# Patient Record
Sex: Female | Born: 1945 | Race: Black or African American | Hispanic: No | Smoking: Never smoker
Health system: Southern US, Community
[De-identification: ages and names within clinical notes are randomized; demographics above are authoritative.]

## PROBLEM LIST (undated history)

## (undated) HISTORY — PX: ABDOMINAL HYSTERECTOMY: SHX81

---

## 2003-02-11 ENCOUNTER — Emergency Department (HOSPITAL_COMMUNITY): Admission: EM | Admit: 2003-02-11 | Discharge: 2003-02-11 | Payer: Self-pay | Admitting: Emergency Medicine

## 2003-03-01 ENCOUNTER — Encounter: Admission: RE | Admit: 2003-03-01 | Discharge: 2003-03-01 | Payer: Self-pay | Admitting: Internal Medicine

## 2006-10-21 ENCOUNTER — Emergency Department (HOSPITAL_COMMUNITY): Admission: EM | Admit: 2006-10-21 | Discharge: 2006-10-21 | Payer: Self-pay | Admitting: Emergency Medicine

## 2007-01-04 ENCOUNTER — Ambulatory Visit (HOSPITAL_COMMUNITY): Admission: RE | Admit: 2007-01-04 | Discharge: 2007-01-05 | Payer: Self-pay | Admitting: Urology

## 2007-10-03 ENCOUNTER — Emergency Department (HOSPITAL_COMMUNITY): Admission: EM | Admit: 2007-10-03 | Discharge: 2007-10-04 | Payer: Self-pay | Admitting: Emergency Medicine

## 2007-10-06 ENCOUNTER — Emergency Department (HOSPITAL_COMMUNITY): Admission: EM | Admit: 2007-10-06 | Discharge: 2007-10-06 | Payer: Self-pay | Admitting: Emergency Medicine

## 2008-07-14 ENCOUNTER — Inpatient Hospital Stay (HOSPITAL_COMMUNITY): Admission: EM | Admit: 2008-07-14 | Discharge: 2008-07-17 | Payer: Self-pay | Admitting: Family Medicine

## 2008-07-14 ENCOUNTER — Ambulatory Visit: Payer: Self-pay | Admitting: Cardiology

## 2008-07-14 ENCOUNTER — Ambulatory Visit: Payer: Self-pay | Admitting: Internal Medicine

## 2008-07-16 ENCOUNTER — Encounter: Payer: Self-pay | Admitting: Infectious Diseases

## 2008-07-25 ENCOUNTER — Ambulatory Visit: Payer: Self-pay | Admitting: Internal Medicine

## 2009-07-18 ENCOUNTER — Emergency Department (HOSPITAL_COMMUNITY): Admission: EM | Admit: 2009-07-18 | Discharge: 2009-07-18 | Payer: Self-pay | Admitting: Emergency Medicine

## 2010-05-13 NOTE — Assessment & Plan Note (Signed)
Summary: NEW HFU-BMET-CHEST PAIN/CFB   Vital Signs:  Patient profile:   65 year old female Height:      65 inches Weight:      193.4 pounds BMI:     32.30 Temp:     97.6 degrees F oral Pulse rate:   91 / minute BP sitting:   140 / 88  (right arm)  Vitals Entered By: Filomena Jungling NT II (July 25, 2008 1:33 PM) CC: HFU Is Patient Diabetic? No Nutritional Status BMI of > 30 = obese  Have you ever been in a relationship where you felt threatened, hurt or afraid?No   Does patient need assistance? Functional Status Self care Ambulation Normal   CC:  HFU.  History of Present Illness: Stephanie Smith is a 6 yo lady recently admitted to the hospital for CP comes today for follow up.   1) CP: Had 1episode of CP pressure like 4-5 days ago, lasted 1 minute and when the pt. was sleeping. It was 3/10. No SOB, n/v or diaphoresis. No cough, fever, chills. She has acid reflux. The pain is better with walking probably per patient and rest makes it better.   2) Preventive health care: Mammogram was done years ago, had 2 negative pap smears in last 6 years.   Preventive Screening-Counseling & Management     Smoking Status: never  Allergies (verified): 1)  ! Morphine 2)  ! Asa  Social History:    Smoking Status:  never  Review of Systems      See HPI  Physical Exam  General:  alert.   Mouth:  pharynx pink and moist.   Lungs:  normal breath sounds, no crackles, and no wheezes.   Heart:  normal rate, regular rhythm, no murmur, no gallop, and no rub.   Abdomen:  soft, non-tender, normal bowel sounds, no distention, and no masses.   Extremities:  trace left pedal edema and trace right pedal edema.   Neurologic:  alert & oriented X3.     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 Had 1 episode of minor CP after d/c. Acid reflux may be contributing. Asked the pt. to continue prilosec.   Problem # 2:   PREVENTIVE HEALTH CARE (ICD-V70.0) Needs mammogram and pap smear. Pt. refused both.   Complete Medication List: 1)  Tylenol 8 Hour 650 Mg Cr-tabs (Acetaminophen) .Marland Kitchen.. 1 pill by mouth  every 6 hourly as needed. 2)  Cvs Omeprazole 20 Mg Tbec (Omeprazole) .Marland Kitchen.. 1 pill by mouth daily.  Patient Instructions: 1)  Please schedule a follow-up appointment as needed. 2)  Limit your Sodium (Salt) to less than 2 grams a day(slightly less than 1/2 a teaspoon) to prevent fluid retention, swelling, or worsening of symptoms. 3)  It is important that you exercise regularly at least 20 minutes 5 times a week. If you develop chest pain, have severe difficulty breathing, or feel very tired , stop exercising immediately and seek medical attention. 4)  You need to lose weight. Consider a lower calorie diet and regular exercise.

## 2010-07-02 LAB — POCT RAPID STREP A (OFFICE): Streptococcus, Group A Screen (Direct): NEGATIVE

## 2010-07-23 LAB — DIFFERENTIAL
Basophils Absolute: 0 10*3/uL (ref 0.0–0.1)
Basophils Relative: 1 % (ref 0–1)
Eosinophils Absolute: 0.1 10*3/uL (ref 0.0–0.7)
Eosinophils Relative: 1 % (ref 0–5)
Lymphocytes Relative: 21 % (ref 12–46)
Lymphs Abs: 1.9 10*3/uL (ref 0.7–4.0)
Monocytes Absolute: 0.6 10*3/uL (ref 0.1–1.0)
Monocytes Relative: 6 % (ref 3–12)
Neutro Abs: 6.4 10*3/uL (ref 1.7–7.7)
Neutrophils Relative %: 71 % (ref 43–77)

## 2010-07-23 LAB — CARDIAC PANEL(CRET KIN+CKTOT+MB+TROPI)
CK, MB: 0.8 ng/mL (ref 0.3–4.0)
CK, MB: 0.9 ng/mL (ref 0.3–4.0)
CK, MB: 0.9 ng/mL (ref 0.3–4.0)
Relative Index: INVALID (ref 0.0–2.5)
Relative Index: INVALID (ref 0.0–2.5)
Relative Index: INVALID (ref 0.0–2.5)
Total CK: 31 U/L (ref 7–177)
Total CK: 67 U/L (ref 7–177)
Total CK: 68 U/L (ref 7–177)
Troponin I: 0.01 ng/mL (ref 0.00–0.06)
Troponin I: <0.01 ng/mL (ref 0.00–0.06)
Troponin I: <0.01 ng/mL (ref 0.00–0.06)

## 2010-07-23 LAB — POCT I-STAT, CHEM 8
BUN: 9 mg/dL (ref 6–23)
Calcium, Ion: 1.18 mmol/L (ref 1.12–1.32)
Chloride: 103 mEq/L (ref 96–112)
Creatinine, Ser: 1.2 mg/dL (ref 0.4–1.2)
Glucose, Bld: 89 mg/dL (ref 70–99)
HCT: 46 % (ref 36.0–46.0)
Hemoglobin: 15.6 g/dL — ABNORMAL HIGH (ref 12.0–15.0)
Potassium: 4.2 mEq/L (ref 3.5–5.1)
Sodium: 139 mEq/L (ref 135–145)
TCO2: 28 mmol/L (ref 0–100)

## 2010-07-23 LAB — CBC
HCT: 38.3 % (ref 36.0–46.0)
HCT: 45.2 % (ref 36.0–46.0)
Hemoglobin: 13 g/dL (ref 12.0–15.0)
Hemoglobin: 15.6 g/dL — ABNORMAL HIGH (ref 12.0–15.0)
MCHC: 34 g/dL (ref 30.0–36.0)
MCHC: 34.5 g/dL (ref 30.0–36.0)
MCHC: 34.7 g/dL (ref 30.0–36.0)
MCV: 96.3 fL (ref 78.0–100.0)
MCV: 96.3 fL (ref 78.0–100.0)
MCV: 97 fL (ref 78.0–100.0)
Platelets: 207 10*3/uL (ref 150–400)
Platelets: 228 10*3/uL (ref 150–400)
Platelets: 263 10*3/uL (ref 150–400)
RBC: 3.95 MIL/uL (ref 3.87–5.11)
RBC: 4.7 MIL/uL (ref 3.87–5.11)
RDW: 12.7 % (ref 11.5–15.5)
RDW: 12.8 % (ref 11.5–15.5)
RDW: 13.3 % (ref 11.5–15.5)
WBC: 6.9 10*3/uL (ref 4.0–10.5)
WBC: 7.3 10*3/uL (ref 4.0–10.5)
WBC: 9.1 10*3/uL (ref 4.0–10.5)

## 2010-07-23 LAB — COMPREHENSIVE METABOLIC PANEL
ALT: 18 U/L (ref 0–35)
AST: 21 U/L (ref 0–37)
Albumin: 3.6 g/dL (ref 3.5–5.2)
Alkaline Phosphatase: 69 U/L (ref 39–117)
BUN: 8 mg/dL (ref 6–23)
CO2: 27 mEq/L (ref 19–32)
Calcium: 9.6 mg/dL (ref 8.4–10.5)
Chloride: 105 mEq/L (ref 96–112)
Creatinine, Ser: 0.95 mg/dL (ref 0.4–1.2)
GFR calc Af Amer: >60 mL/min (ref >60)
GFR calc non Af Amer: 60 mL/min — ABNORMAL LOW (ref >60)
Glucose, Bld: 129 mg/dL — ABNORMAL HIGH (ref 70–99)
Potassium: 4.1 mEq/L (ref 3.5–5.1)
Sodium: 140 mEq/L (ref 135–145)
Total Bilirubin: 0.7 mg/dL (ref 0.3–1.2)
Total Protein: 7.7 g/dL (ref 6.0–8.3)

## 2010-07-23 LAB — POCT CARDIAC MARKERS
CKMB, poc: <1 ng/mL — ABNORMAL LOW (ref 1.0–8.0)
Myoglobin, poc: 72.2 ng/mL (ref 12–200)
Troponin i, poc: <0.05 ng/mL (ref 0.00–0.09)

## 2010-07-23 LAB — POCT URINALYSIS DIP (DEVICE)
Bilirubin Urine: NEGATIVE
Glucose, UA: NEGATIVE mg/dL
Hgb urine dipstick: NEGATIVE
Ketones, ur: NEGATIVE mg/dL
Nitrite: NEGATIVE
Protein, ur: NEGATIVE mg/dL
Specific Gravity, Urine: 1.01 (ref 1.005–1.030)
Urobilinogen, UA: 0.2 mg/dL (ref 0.0–1.0)
pH: 6 (ref 5.0–8.0)

## 2010-07-23 LAB — TSH: TSH: 2.207 u[IU]/mL (ref 0.350–4.500)

## 2010-07-23 LAB — LIPID PANEL
Cholesterol: 154 mg/dL (ref 0–200)
HDL: 49 mg/dL (ref >39)
LDL Cholesterol: 94 mg/dL (ref 0–99)
Total CHOL/HDL Ratio: 3.1 RATIO
Triglycerides: 55 mg/dL (ref <150)
VLDL: 11 mg/dL (ref 0–40)

## 2010-07-23 LAB — BASIC METABOLIC PANEL
BUN: 8 mg/dL (ref 6–23)
BUN: 9 mg/dL (ref 6–23)
CO2: 23 mEq/L (ref 19–32)
Calcium: 9.6 mg/dL (ref 8.4–10.5)
Chloride: 105 mEq/L (ref 96–112)
Chloride: 109 mEq/L (ref 96–112)
Creatinine, Ser: 0.72 mg/dL (ref 0.4–1.2)
Creatinine, Ser: 1 mg/dL (ref 0.4–1.2)
GFR calc Af Amer: >60 mL/min (ref >60)
GFR calc non Af Amer: 56 mL/min — ABNORMAL LOW (ref >60)
Glucose, Bld: 98 mg/dL (ref 70–99)
Potassium: 4 mEq/L (ref 3.5–5.1)
Sodium: 138 mEq/L (ref 135–145)

## 2010-07-23 LAB — HEPARIN LEVEL (UNFRACTIONATED)
Heparin Unfractionated: 0.78 IU/mL — ABNORMAL HIGH (ref 0.30–0.70)
Heparin Unfractionated: 0.85 IU/mL — ABNORMAL HIGH (ref 0.30–0.70)
Heparin Unfractionated: <0.1 IU/mL — ABNORMAL LOW (ref 0.30–0.70)

## 2010-07-23 LAB — HEMOGLOBIN A1C
Hgb A1c MFr Bld: 5.5 % (ref 4.6–6.1)
Mean Plasma Glucose: 111 mg/dL

## 2010-07-23 LAB — PROTIME-INR
INR: 1.2 (ref 0.00–1.49)
Prothrombin Time: 15.2 seconds (ref 11.6–15.2)

## 2010-07-23 LAB — D-DIMER, QUANTITATIVE: D-Dimer, Quant: 0.24 ug/mL-FEU (ref 0.00–0.48)

## 2010-07-23 LAB — MAGNESIUM: Magnesium: 2.2 mg/dL (ref 1.5–2.5)

## 2010-07-23 LAB — CK TOTAL AND CKMB (NOT AT ARMC)
CK, MB: 0.9 ng/mL (ref 0.3–4.0)
Relative Index: INVALID (ref 0.0–2.5)
Total CK: 42 U/L (ref 7–177)

## 2010-07-23 LAB — TROPONIN I: Troponin I: <0.01 ng/mL (ref 0.00–0.06)

## 2010-08-26 NOTE — Consult Note (Signed)
Stephanie Smith, WARF NO.:  [id number]   MEDICAL RECORD NO.:  [id number]           PATIENT TYPE:   LOCATION:                                 FACILITY:   PHYSICIAN:  Jonelle Sidle, MD DATE OF BIRTH:  10-17-1945   DATE OF CONSULTATION:  DATE OF DISCHARGE:                                 CONSULTATION   REQUESTING PHYSICIAN:  Alvester Morin, MD with the Internal Medicine  Teaching Service.   REASON FOR CONSULTATION:  Chest pain, back pain, and shortness of  breath.   HISTORY OF PRESENT ILLNESS:  Stephanie Smith is a pleasant 65 year old woman  with no reported major medical conditions, although no regular medical  followup, who presents with recent onset upper back as well as chest  discomfort associated with shortness of breath and functional  limitation.  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  On Wednesday, she left work early around  noon and went home to take a nap.  She felt fatigued and when she woke  up noted discomfort in her back described as a soreness.  This waxed  and waned, and was noticeable again on Thursday.  She did not go to work  that day and when ambulating even just around her house, she felt  significantly more short of breath than normal, and also felt an  occasional fluttering in her chest as well as subsequently chest  pressure that caused her to stop what she was doing.  These symptoms  recurred over the next few days and ultimately she presented to the  hospital for further assessment.  Her electrocardiogram is abnormal at  baseline, although nonspecifically so, showing increased voltage with  diffuse nonspecific ST-T wave changes and poor anterior R-wave  progression.  Her cardiac markers are normal including troponin I levels  and CK-MB levels.  Since observation in the hospital, she has had  recurrent chest pressure at rest.  Her chest x-ray shows mild  cardiomegaly with no active  disease process and no mediastinal  enlargement.  We have been asked to assist with her evaluation.   ALLERGIES:  MORPHINE.  She apparently has had some ASPIRIN intolerance  due to nausea and GI upset, but no frank allergy.   MEDICATIONS:  At the present time include,  1. Aspirin 325 mg p.o. daily.  2. Lopressor 12.5 mg p.o. b.i.d.  3. Protonix 40 mg p.o. daily.  4. Sublingual nitroglycerin 0.4 mg p.r.n.  5. Tylenol 650 mg p.o. q.4 h. p.r.n.   She is on no regular medications at home.   PAST MEDICAL HISTORY:  Bladder prolapse status post repair in September  2008.  She has had previous colonoscopy and Pap smears.  She had a  salpingo-oophorectomy and hysterectomy as well as an appendectomy.  She  denies any longstanding problems of hypertension, hyperlipidemia or type  2 diabetes mellitus.  No known history of obstructive coronary artery  disease or myocardial infarction.   FAMILY HISTORY:  Reviewed.  She states that heart failure  has been  present in siblings and other first-degree relatives beginning at age 28-  37.  Coronary artery disease also noted in this population.   SOCIAL HISTORY:  The patient is divorced.  She denies any tobacco or  alcohol use.  She has three children.  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  She does not have regular medical followup.   REVIEW OF SYSTEMS:  As outlined above.  She typically has no exertional  chest pain or limiting breathlessness.  She has had some recent  sneezing, but no cough, fevers, chills, or hemoptysis.  No orthopnea or  PND.  No dizziness or syncope.  She reports a stable appetite.  Otherwise reviewed and negative.   PHYSICAL EXAMINATION:  VITAL SIGNS:  Temperature is 98.2 degrees, heart  rate 75, respirations 18, blood pressure 112/63, oxygen saturation is  100% on room air.  GENERAL:  This is an overweight woman, lying in bed, in no acute  distress without active chest pain at present.  HEENT:  Conjunctiva  and lids normal.  Oropharynx clear.  NECK:  Supple.  No elevated jugular venous pressure.  No loud bruits.  No thyromegaly is noted.  LUNGS:  Clear without labored breathing.  CARDIAC:  Regular rate and rhythm.  Normal S1 and S2.  No pathologic  systolic murmur or S3 gallop.  No pericardial rub is audible.  ABDOMEN:  Obese, nontender.  No hepatomegaly.  Bowel sounds are present.  EXTREMITIES:  Exhibit no frank pitting edema noted distally.  Venous  stasis changes are observed in both legs below the knees.  Distal pulses  are 1+.  SKIN:  Warm and dry.  MUSCULOSKELETAL:  No kyphosis noted.  NEUROPSYCHIATRIC:  The patient is alert and oriented x3.  Affect is  appropriate.   LABORATORY DATA:  WBCs 9.1, hemoglobin 15.6, hematocrit 45.2, platelets  263.  D-dimer 0.24.  Sodium 140, potassium 4.1, chloride 105, bicarb 27,  glucose 129, BUN 8, creatinine 0.9, peak CK of 68, peak CK-MB 0.9, peak  troponin-I 0.01, total cholesterol 154, triglycerides 55, HDL 49, LDL  94.  TSH 2.2.  Urinalysis normal.   IMPRESSION:  Recent onset back discomfort, shortness of breath,  intermittent chest fluttering, and chest pressure concerning for  recent onset unstable angina.  Electrocardiogram is abnormal at  baseline, although in nonspecific fashion and cardiac markers at this  point are normal.  She has had recurrent chest pressure during  observation and her telemetry monitoring is unrevealing.  She does have  some family history of cardiovascular disease as well as heart failure  and had a random elevated glucose of 129 with LDL under 100 on no  specific medical therapy.  She has had no prior diagnosis of coronary  artery disease or previous cardiac risk stratification.   RECOMMENDATIONS:  I reviewed the situation with Ms. Galik and we  discussed both noninvasive and invasive techniques for further diagnosis  of potential underlying ischemic heart disease.  After reviewing the  risk/benefit profile, she  has agreed to proceed with a diagnostic  cardiac catheterization to best define her coronary anatomy and assess  for any potential revascularization options.  She will be continued on  aspirin and beta-blocker therapy, and we will add a heparin infusion  given her recurrent chest pressure during observation at rest.  Would  also send a hemoglobin A1c given her elevated random glucose level.  We  will plan to follow with you.  Jonelle Sidle, MD  Electronically Signed     SGM/MEDQ  D:  07/15/2008  T:  07/16/2008  Job:  [id number]   cc:   Alvester Morin, M.D.

## 2010-08-26 NOTE — Discharge Summary (Signed)
[name], Stephanie Smith NO.:  [id number]   MEDICAL RECORD NO.:  [id number]          PATIENT TYPE:  INP   LOCATION:  4735                         FACILITY:  [facility]   PHYSICIAN:  Stephanie Smith, M.D.  DATE OF BIRTH:  March 24, 1946   DATE OF ADMISSION:  07/14/2008  DATE OF DISCHARGE:  07/17/2008                               DISCHARGE SUMMARY   CONSULTING PHYSICIANS:  Stephanie Sidle, MD, and Stephanie Fells. Excell Seltzer,  MD, of [facility].   DISCHARGE DIAGNOSES:  1. Chest pain and shortness of breath of unknown etiology, cardiac      catheterization performed showing minor nonobstructive coronary      artery disease, chest pain resolved at discharge.  2. Minor nonobstructive coronary artery disease with cardiologist      recommendation of cardiovascular risk management.  3. Back pain of unknown etiology, most likely musculoskeletal.  4. Status post distant hysterectomy.  5. Status post salpingo-oophorectomy in the distant past.  6. Recent bladder prolapse repair in 2008.  7. No other past medical history.   DISCHARGE MEDICATIONS:  1. Tylenol 650 mg p.o. q.6 h. p.r.n. pain.  2. Omeprazole 20 mg p.o. q.a.m.   CONDITION ON DISCHARGE:  The patient was chest pain free and was  asymptomatic upon discharge.  On the day prior to discharge, the patient  underwent coronary artery catheterization which showed minor  nonobstructive CAD, thus ruling out the patient's symptoms as being due  to the cardiac etiology.  The patient is to follow up with Stephanie Smith  of the Outpatient Clinic on July 25, 2008, at 1:30 p.m.  At that  appointment, the patient should be evaluated for continuation of her  symptoms.  The patient does not have a primary care doctor and has not  undergone routine screening for some while.  She reports that her last  Pap and mammogram were at least 5 years ago and her last colonoscopy was  20 years ago.  Please address these at her followup. Please also  start  the patient on a statin to prevent plaque rupture given her CAD, albeit  only minor non-obstructive CAD.   CONSULTATIONS:  Stephanie Sidle, MD, and Stephanie Fells. Excell Seltzer, MD, of  [facility].   PROCEDURES:  1. Left heart catheterization performed by Stephanie Smith on July 16, 2008:  Minor nonobstructive coronary artery disease as outlined      above.  Normal left ventricular function.  2. A 2-D echo performed on July 16, 2008, with following impression:      Overall left ventricular systolic function was normal.  Left      ventricular ejection fraction was estimated to be between 55-60%.      The study was inadequate for evaluation of left ventricular      regional wall motion.  Left atrium was mildly dilated.  3. Two-view chest x-ray on July 14, 2008, with the following      impression:  Mild cardiomegaly.  No active lung disease.   HISTORY AND PHYSICAL:  The patient is a 65 year old  female with no  significant past medical history who presents with a 3-day history of  bilateral upper back pain associated with shortness of breath.  The back  pain extends from her lower scapula to shoulders and is intermittent and  pounding like a baseball bat against her back.  Initially, the patient  reported that the pain improved with movement and the patient has had  corresponding shortness of breath that also improved with movement.  However, the patient later said that the shortness of breath worsened  with exertion and the patient had more typical chest pain.  The patient  denied any other aggravating factors.  The patient reported mild  dizziness, but no syncope.  The patient has had minimal nasal  congestion, but no other upper respiratory tract infection symptoms.  The patient has experienced new increased burping/sour taste in her  mouth over the last few days.  She denies fever, cough, numbness, vision  changes, recent travel, or sick contacts.   PHYSICAL  EXAMINATION:  ADMISSION VITAL SIGNS:  Temperature 98.5, blood  pressure 130-135/76-94, pulse 60-76, respiratory rate 16-20, and O2 sat  98% on room air.  GENERAL:  No apparent distress.  EYES:  EOMI.  ENT:  Moist membranous mucosa.  No oropharyngeal exudate or erythema.  NECK:  No lymphadenopathy.  RESPIRATORY:  Clear to auscultation bilaterally.  CARDIOVASCULAR:  Regular rate and rhythm.  No murmurs, rubs, or gallops.  Normal S1 and S2.  No chest wall tenderness.  GI:  Normoactive bowel sounds, soft, nontender, and nondistended.  EXTREMITIES:  Some mild varicose veins bilaterally.  No cyanosis,  clubbing, or edema.  GU:  No CVA tenderness.  MUSCULOSKELETAL:  Tension thoracovertebral muscle, question of spasms in  the muscles.  NEURO:  Alert and oriented x3, nonfocal.  PSYCH:  Appropriate.   INITIAL LABORATORY DATA:  Sodium 138, potassium 4.0, chloride of 105,  bicarb 23, BUN of 9, creatinine 1.00, and glucose of 98.  D-dimer of  0.24.  CBC as follows:  White blood cell count of 9.1, hemoglobin 15.6,  and platelets of 263.   HOSPITAL COURSE:  1. Chest pain with shortness of breath:  The patient presented with 4-      day history of what sounded like exertional chest pain and      shortness of breath and she had limited medical followup      previously.  She had limited coronary risk factors, really only      significant for a mother who died of heart attack at 50.  The      patient has never smoked, does not have a history of high blood      pressure and was normotensive upon admission, her cholesterol was      notable for an LDL of 94, and the patient was not diabetic.      However, given her typical presentation for cardiac ischemia in the      presence of continued chest discomfort while in the hospital, the      patient had 3 sets of cardiac enzymes all of which were negative,      the patient had EKG which showed nonspecific ST-T wave changes and      poor anterior R-wave  progression, and Cardiology was consulted.      Stephanie Smith came and evaluated the patient on the day after      admission and recommended a coronary catheterization which was      performed by  Stephanie Smith on the subsequent day.  As noted      above, the catheterization only showed minor nonobstructive      coronary artery disease and no intervention was indicated.  Other      considerations for the patient's chest pain were pulmonary embolus      which was ruled out with a negative D-dimer, GI reflux, or      dyspepsia which is possible especially with the patient's sensation      of sour taste in her mouth and sensation of reflux.      Musculoskeletal considerations are also possible given the      patient's tense thoracovertebral muscles.  For the above, the      patient was discharged on PPI and Tylenol.  The patient was      asymptomatic upon discharge and will follow up in our outpatient      clinic on July 25, 2008.  2. Back pain:  The patient was asymptomatic upon discharge and as      p.r.n. Tylenol for her back pain and will follow up in the      Outpatient Clinic as above.  3. Routine health maintenance:  The patient has not had a mammogram,      Pap smear, or colonoscopy in at least 5 years.  She will follow up      in the Outpatient Clinic where the services should be provided.   DISCHARGE LABORATORY DATA:  The patient's last sodium was 140, potassium  3.8, chloride of 109, bicarb 25, BUN of 8, creatinine of 0.72, and  glucose of 90.  The patient's last white blood cell count 7.3,  hemoglobin 12.9, and platelet of 228.   PENDING LABORATORY DATA:  There were no pending labs at this time.      Linward Foster, MD  Electronically Signed      Stephanie Smith, M.D.  Electronically Signed    LW/MEDQ  D:  07/17/2008  T:  07/17/2008  Job:  [id number]   cc:   Stephanie Fells. Excell Seltzer, MD  Jason Coop, MD

## 2010-08-26 NOTE — Op Note (Signed)
NAMEGUSTAVA, Stephanie Smith                ACCOUNT NO.:  [id number]   MEDICAL RECORD NO.:  [id number]          PATIENT TYPE:  OIB   LOCATION:  1434                         FACILITY:  [facility]   PHYSICIAN:  Martina Sinner, MD DATE OF BIRTH:  1946/03/22   DATE OF PROCEDURE:  01/04/2007  DATE OF DISCHARGE:                               OPERATIVE REPORT   PREOPERATIVE DIAGNOSIS:  Vault prolapse, cystocele, small rectocele.   POSTOPERATIVE DIAGNOSIS:  Vault prolapse, cystocele, small rectocele.   PROCEDURE:  Vault prolapse repair, cystocele repair plus graft, plus  cystoscopy.   INDICATIONS FOR PROCEDURE:  Ms. Stephanie Smith has a symptomatic cystocele  with a cuff defect.  She had minimal posterior defect on physical  examination.   DESCRIPTION OF PROCEDURE:  The patient was prepped and draped in the  usual fashion.  Extra care was taken in leg positioning to minimize the  risk of compartment syndrome, neuropathy, and DVT.  Preoperative  laboratory tests were normal.  She was given preoperative antibiotics.   Under anesthesia, her vaginal cuff descended to approximately 2 cm from  the introitus.  With the cuff supported cephalad, she had minimal defect  posteriorly.  In my opinion, she did not have an enterocele.   I placed two 3-0 Vicryl sutures, marking the vaginal apex at the  dimples.  Between two Allis clamps, I made a T-shaped anterior vaginal  wall incision after instilling approximately 16 mL of an epinephrine  lidocaine mixture.  I sharply dissected the vaginal wall from the  pubocervical fascia to the pelvic sidewall bilaterally.  She had a  fairly short anterior vaginal wall in spite of opening the incision to  include the proximal urethra.  I sharply dissected the urethrovesical  angle as well as the white line.  There is no question that I took the  cystocele off the vaginal apex.  There was no enterocele.   I did a two-layer gentle imbricating anterior repair.  This allowed  me  to dissect further laterally.   I then cystoscoped the patient.  There was efflux of indigo carmine from  both ureteral orifices.  She had a very large redundant bladder, as  noted in the preoperative workup.  The trigone was not distorted.  I  gave 2 ampules of indigo carmine, and the whole process took  approximately 15 minutes to make certain there was efflux from both  ureteral orifices.  All along, I thought there was efflux, but it was so  faint, I spent many minutes making certain that there were good jets  bilaterally, and there was.   Following this, I finger dissected bluntly back to the ischial spine  bilaterally.  There little to no bleeding.  The sacrospinous ligament  was palpable, and she had reasonably large ischial spines that were  quite flat.  With a catheter device, I placed a 0 Ethibond at each  ischial spine just including the sacrospinous ligament.  I double  checked the position, and I was very happy with the position of the  sacrospinous fixation sutures.  Using a UR-6 needle, I  then placed 0  Ethibond near the urethrovesical angle through the pelvic sidewall.   I trimmed a 10 x 6-cm dermal graft in the shape of a trapezoid.  I  sutured this to my four sutures.  I was very pleased the way the graft  went back all the way to the ischial spines and to the urethrovesical  angle.  It fit very nicely and did not encroach upon the proximal  urethra.  I trimmed approximately 1 cm of its width to accommodate this.  I cut the Ethibond sutures back to the ischial spines fairly long just  in case I ever had to find them again.   I trimmed approximately a half a centimeter of vaginal wall mucosa from  both sides.  I then irrigated with antibiotic and saline.  I closed the  anterior vaginal wall with running 2-0 Vicryl on a TT-1 needle.   Before I closed the anterior vaginal wall incision, I made certain the  graft was pulled upward underneath the majority of the  bladder.  I was  very pleased with the position of the graft and its area of coverage and  support.  There was no question that it lengthened the vagina, and there  was no vaginal narrowing.   Visually, she had minimal posterior defect.  I did a digital rectal  examination.  She did have some deficiency of the posterior fourchette,  as noted in the preoperative workup.  I really thought she had some  diffuse weakness under reasonable tension and not a lot of obvious  bulging.  I did not feel that she would benefit from a posterior repair,  recognizing potential recurrence rates.   The vagina was irrigated.  A vaginal pack was inserted with Estrace  cream.  The catheter was draining blue urine at the end of the case.   I was pleased with leg position.  The surgery went very well and  hopefully it will reach her treatment goal.           ______________________________  Martina Sinner, MD  Electronically Signed     SAM/MEDQ  D:  01/04/2007  T:  01/04/2007  Job:  [id number]

## 2011-01-08 LAB — CBC
Hemoglobin: 13.4
MCHC: 34.7
MCV: 95.2
RBC: 4.07

## 2011-01-08 LAB — POCT I-STAT, CHEM 8
BUN: 13
Chloride: 104
Creatinine, Ser: 0.9
Potassium: 3.7
Sodium: 142

## 2011-01-08 LAB — DIFFERENTIAL
Basophils Relative: 1
Eosinophils Absolute: 0.1
Lymphs Abs: 1.8
Monocytes Absolute: 0.6
Monocytes Relative: 6

## 2011-01-08 LAB — CULTURE, ROUTINE-ABSCESS

## 2011-01-22 LAB — BASIC METABOLIC PANEL
Calcium: 8.8
Creatinine, Ser: 0.84
GFR calc non Af Amer: >60
Glucose, Bld: 138 — ABNORMAL HIGH
Sodium: 140

## 2011-01-22 LAB — CBC
Hemoglobin: 11.1 — ABNORMAL LOW
Hemoglobin: 13.1
Hemoglobin: 13.2
MCHC: 33.3
MCHC: 34
MCHC: 34.5
MCV: 93.9
MCV: 94.1
Platelets: 215
RDW: 11.6
RDW: 11.8
RDW: 12.2

## 2011-01-22 LAB — PROTIME-INR
INR: 1
Prothrombin Time: 13.8

## 2012-02-08 ENCOUNTER — Emergency Department (HOSPITAL_COMMUNITY)
Admission: EM | Admit: 2012-02-08 | Discharge: 2012-02-08 | Disposition: A | Discharge status: Home / Self Care | Payer: Self-pay | Attending: Emergency Medicine | Admitting: Emergency Medicine

## 2012-02-08 ENCOUNTER — Encounter (HOSPITAL_COMMUNITY): Payer: Self-pay | Admitting: Cardiology

## 2012-02-08 ENCOUNTER — Emergency Department (HOSPITAL_COMMUNITY): Payer: Self-pay

## 2012-02-08 ENCOUNTER — Encounter (HOSPITAL_COMMUNITY): Payer: Self-pay

## 2012-02-08 ENCOUNTER — Emergency Department (INDEPENDENT_AMBULATORY_CARE_PROVIDER_SITE_OTHER)
Admission: EM | Admit: 2012-02-08 | Discharge: 2012-02-08 | Disposition: A | Discharge status: Nursing Home (Includes ICF) | Payer: Self-pay | Source: Home / Self Care | Attending: Family Medicine | Admitting: Family Medicine

## 2012-02-08 DIAGNOSIS — R0602 Shortness of breath: Secondary | ICD-10-CM

## 2012-02-08 DIAGNOSIS — R42 Dizziness and giddiness: Secondary | ICD-10-CM

## 2012-02-08 DIAGNOSIS — I10 Essential (primary) hypertension: Secondary | ICD-10-CM

## 2012-02-08 DIAGNOSIS — R609 Edema, unspecified: Secondary | ICD-10-CM

## 2012-02-08 DIAGNOSIS — R51 Headache: Secondary | ICD-10-CM

## 2012-02-08 DIAGNOSIS — R030 Elevated blood-pressure reading, without diagnosis of hypertension: Secondary | ICD-10-CM

## 2012-02-08 LAB — BASIC METABOLIC PANEL
BUN: 9 mg/dL (ref 6–23)
Chloride: 106 mEq/L (ref 96–112)
GFR calc Af Amer: >90 mL/min (ref >90)
GFR calc non Af Amer: >90 mL/min (ref >90)
Potassium: 4 mEq/L (ref 3.5–5.1)
Sodium: 140 mEq/L (ref 135–145)

## 2012-02-08 LAB — CBC
HCT: 42.8 % (ref 36.0–46.0)
Hemoglobin: 14.5 g/dL (ref 12.0–15.0)
RBC: 4.59 MIL/uL (ref 3.87–5.11)
WBC: 7.8 10*3/uL (ref 4.0–10.5)

## 2012-02-08 LAB — POCT I-STAT TROPONIN I: Troponin i, poc: 0 ng/mL (ref 0.00–0.08)

## 2012-02-08 NOTE — ED Provider Notes (Signed)
History     CSN: [id number]  Arrival date & time 02/08/12  1150   First MD Initiated Contact with Patient 02/08/12 1423      Chief Complaint  Patient presents with  . Headache    (Consider location/radiation/quality/duration/timing/severity/associated sxs/prior treatment) Patient is a 66 y.o. female presenting with headaches. The history is provided by the patient.  Headache The primary symptoms include headaches, dizziness and visual change. Primary symptoms do not include syncope, loss of consciousness, altered mental status, seizures, paresthesias, focal weakness, loss of sensation, speech change, memory loss, nausea or vomiting. The symptoms began 5 to 7 days ago. The episode lasted 2 hours (intermittent, top of head). The symptoms are waxing and waning. The neurological symptoms are focal.  The headache is associated with visual change. The headache is not associated with photophobia, eye pain, neck stiffness, paresthesias, weakness or loss of balance.  She describes the dizziness as a sensation of spinning. The dizziness has been unchanged since its onset. It is a new problem. Dizziness also occurs with blurred vision. Dizziness does not occur with tinnitus, nausea, vomiting or weakness.   The visual change began more than 2 days ago. The visual change has been unchanged since its onset.The visual change includes blurred vision. The visual change does not include photophobia.   Additional symptoms do not include neck stiffness, weakness, loss of balance, photophobia or tinnitus. Workup history does not include MRI, CT scan, cerebral angiography, lumbar puncture, carotid ultrasound or cardiac workup.    History reviewed. No pertinent past medical history.  Past Surgical History  Procedure Date  . Abdominal hysterectomy     No family history on file.  History  Substance Use Topics  . Smoking status: Never Smoker   . Smokeless tobacco: Not on file  . Alcohol Use: No     OB History    Grav Para Term Preterm Abortions TAB SAB Ect Mult Living                  Review of Systems  Constitutional: Negative.   HENT: Negative for neck stiffness and tinnitus.   Eyes: Positive for blurred vision. Negative for photophobia and pain.  Respiratory: Negative.   Cardiovascular: Negative.  Negative for syncope.  Gastrointestinal: Negative for nausea and vomiting.  Neurological: Positive for dizziness and headaches. Negative for speech change, focal weakness, seizures, loss of consciousness, weakness, paresthesias and loss of balance.  Psychiatric/Behavioral: Negative for memory loss and altered mental status.  All other systems reviewed and are negative.    Allergies  Aspirin and Morphine  Home Medications  No current outpatient prescriptions on file.  BP 190/90  Pulse 75  Temp 98.3 F (36.8 C) (Oral)  Resp 16  SpO2 100%  Physical Exam  Nursing note and vitals reviewed. Constitutional: She is oriented to person, place, and time. Vital signs are normal. She appears well-developed and well-nourished. She is active and cooperative.  HENT:  Head: Normocephalic.  Right Ear: External ear normal.  Left Ear: External ear normal.  Nose: Nose normal.  Mouth/Throat: Oropharynx is clear and moist. No oropharyngeal exudate.  Eyes: Conjunctivae normal and EOM are normal. Pupils are equal, round, and reactive to light. No scleral icterus.  Neck: Trachea normal, normal range of motion, full passive range of motion without pain and phonation normal. Neck supple. Normal carotid pulses present. No spinous process tenderness and no muscular tenderness present. Carotid bruit is not present.  Cardiovascular: Normal rate, regular rhythm, S1 normal, normal heart  sounds, intact distal pulses and normal pulses.        Bilateral nonpitting edema  Pulmonary/Chest: Effort normal and breath sounds normal.  Abdominal: Soft. Normal appearance and bowel sounds are normal. There is  no tenderness. There is no rebound.  Musculoskeletal: Normal range of motion.  Lymphadenopathy:       Head (right side): No submental, no submandibular, no tonsillar, no preauricular, no posterior auricular and no occipital adenopathy present.       Head (left side): No submental, no submandibular, no tonsillar, no preauricular, no posterior auricular and no occipital adenopathy present.    She has no cervical adenopathy.  Neurological: She is alert and oriented to person, place, and time. She has normal strength. No cranial nerve deficit or sensory deficit. Coordination and gait normal. GCS eye subscore is 4. GCS verbal subscore is 5. GCS motor subscore is 6.       Maex4, bilateral equal strength, no pastpointing  Skin: Skin is warm and dry.  Psychiatric: She has a normal mood and affect. Her speech is normal and behavior is normal. Judgment and thought content normal. Cognition and memory are normal.    ED Course  Procedures (including critical care time)     1. Elevated BP   2. SOB (shortness of breath)   3. Edema       MDM  Transfer to [facility] for further evaluation and management.        Johnsie Kindred, NP 02/10/12 1828

## 2012-02-08 NOTE — ED Provider Notes (Signed)
History     CSN: [id number]  Arrival date & time 02/08/12  1502   First MD Initiated Contact with Patient 02/08/12 1754      Chief Complaint  Patient presents with  . Hypertension  . Shortness of Breath    (Consider location/radiation/quality/duration/timing/severity/associated sxs/prior treatment) Patient is a 66 y.o. female presenting with headaches. The history is provided by the patient and a relative. No language interpreter was used.  Headache  This is a new problem. The current episode started more than 2 days ago. The problem occurs hourly. The problem has been gradually worsening. The headache is associated with nothing. The pain is located in the parietal (top of head) region. The quality of the pain is described as dull. The pain is at a severity of 7/10. The pain is moderate. The pain does not radiate. Associated symptoms include shortness of breath and nausea. Pertinent negatives include no fever, no chest pressure and no vomiting. She has tried nothing for the symptoms. The treatment provided no relief.    History reviewed. No pertinent past medical history.  Past Surgical History  Procedure Date  . Abdominal hysterectomy     History reviewed. No pertinent family history.  History  Substance Use Topics  . Smoking status: Never Smoker   . Smokeless tobacco: Not on file  . Alcohol Use: No    OB History    Grav Para Term Preterm Abortions TAB SAB Ect Mult Living                  Review of Systems  Constitutional: Negative for fever, chills, activity change and appetite change.  HENT: Negative for congestion, rhinorrhea, neck pain, neck stiffness and sinus pressure.   Eyes: Negative for discharge and visual disturbance.  Respiratory: Positive for shortness of breath. Negative for cough, chest tightness, wheezing and stridor.   Cardiovascular: Negative for chest pain and leg swelling.  Gastrointestinal: Positive for nausea. Negative for vomiting, abdominal  pain, diarrhea and abdominal distention.  Genitourinary: Negative for decreased urine volume and difficulty urinating.  Musculoskeletal: Negative for back pain and arthralgias.  Skin: Negative for color change and pallor.  Neurological: Positive for headaches. Negative for weakness and light-headedness.  Psychiatric/Behavioral: Negative for behavioral problems and agitation.  All other systems reviewed and are negative.    Allergies  Aspirin and Morphine  Home Medications  No current outpatient prescriptions on file.  BP 181/64  Pulse 73  Temp 98.1 F (36.7 C)  Resp 16  SpO2 100%  Physical Exam  Nursing note and vitals reviewed. Constitutional: She is oriented to person, place, and time. She appears well-developed and well-nourished. No distress.  HENT:  Head: Normocephalic and atraumatic.  Mouth/Throat: No oropharyngeal exudate.  Eyes: EOM are normal. Pupils are equal, round, and reactive to light. Right eye exhibits no discharge. Left eye exhibits no discharge.  Neck: Normal range of motion. Neck supple. No JVD present.  Cardiovascular: Normal rate, regular rhythm and normal heart sounds.   Pulmonary/Chest: Effort normal and breath sounds normal. No stridor. No respiratory distress. She exhibits no tenderness.  Abdominal: Soft. Bowel sounds are normal. She exhibits no distension. There is no tenderness. There is no guarding.  Musculoskeletal: Normal range of motion. She exhibits no edema and no tenderness.  Neurological: She is alert and oriented to person, place, and time. She has normal reflexes. She displays normal reflexes. No cranial nerve deficit. She exhibits normal muscle tone. Coordination normal.       DHT  neg bilat  Skin: Skin is warm and dry. No rash noted. She is not diaphoretic.  Psychiatric: She has a normal mood and affect. Her behavior is normal. Judgment and thought content normal.    ED Course  Procedures (including critical care time)   Labs Reviewed   BASIC METABOLIC PANEL  CBC  POCT I-STAT TROPONIN I   Dg Chest 2 View  02/08/2012  *RADIOLOGY REPORT*  Clinical Data: Headache.  Hypertension.  Short of breath.  CHEST - 2 VIEW  Comparison: 07/14/2008  Findings: Heart size is at the upper limits of normal with left ventricular prominence.  The aorta is unfolded.  The lungs are clear.  The vascularity is normal.  No effusions.  No significant bony finding.  IMPRESSION: Left ventricular prominence.  Unfolded aorta.  No active disease evident.   Original Report Authenticated By: Thomasenia Sales, M.D.    Ct Head Wo Contrast  02/08/2012  *RADIOLOGY REPORT*  Clinical Data: Headache.  Hypertension  CT HEAD WITHOUT CONTRAST  Technique:  Contiguous axial images were obtained from the base of the skull through the vertex without contrast.  Comparison: None.  Findings: Ventricle size is normal.  Negative for intracranial hemorrhage.  No acute infarct or mass.  Mild hypodensity in the periventricular white matter.  Calvarium is intact.  IMPRESSION: No acute abnormality.   Original Report Authenticated By: Camelia Phenes, M.D.    Mr Brain Wo Contrast  02/08/2012  *RADIOLOGY REPORT*  Clinical Data: Headache.  MRI HEAD WITHOUT CONTRAST  Technique:  Multiplanar, multiecho pulse sequences of the brain and surrounding structures were obtained according to standard protocol without intravenous contrast.  Comparison: CT head without contrast 02/08/2012.  Findings: No acute infarct, hemorrhage, or mass lesion is present. Mild periventricular scattered subcortical T2 and FLAIR hyperintensities are slightly greater than expected for age.  No hemorrhage or mass lesion is present.  Flow is present in the major intracranial arteries.  The globes orbits are intact.  The paranasal sinuses and mastoid air cells are clear.  IMPRESSION:  1.  No acute intracranial abnormality. 2.  Mild periventricular and scattered subcortical T2 hyperintensities are slightly greater than expected  for age. The finding is nonspecific but can be seen in the setting of chronic microvascular ischemia, a demyelinating process such as multiple sclerosis, vasculitis, complicated migraine headaches, or as the sequelae of a prior infectious or inflammatory process.   Original Report Authenticated By: Jamesetta Orleans. MATTERN, M.D.      No diagnosis found.    MDM  6:42 PM pt was sent from urgent care for further testing due to headache. Since it's a new onset headache and she also complains of generalized weakness in vertigo, will start with CT head to further evaluate.  9:02 PM CT head was normal. MRI was obtained to further evaluate in the posterior fossa for possible posterior circulation stroke. This was negative for any acute findings. The cause of the patient's vertigo is likely a benign peripheral etiology. Dix-Hallpike testing was negative. Patient remained  hypertensive throughout ED stay so she was instructed on close followup to get this rechecked and possibly start antihypertensive medication. Patient was asymptomatic on reexamination without complaints. Her headache may be related to tension type headache versus hypertensive headache. I doubt she has an ischemic or hemorrhagic stroke, mass, increased intracranial pressure, aVM, hypertensive encephalopathy, dissection or other serious etiology to her headache. Pt deemed stable for discharge. Return precautions were provided and pt expressed understanding to return to ED  if any acute symptoms return. Follow up was instructed which pt also expressed understanding. All questions were answered and pt was in agreement w/ plan.         Warrick Parisian, MD 02/08/12 [phone]

## 2012-02-08 NOTE — ED Notes (Signed)
MD at bedside. 

## 2012-02-08 NOTE — ED Notes (Signed)
Patient states she has had a headache located on the top of her head that comes and goes since 10/23, states was at the dentist recently and was told her BP was elevated at that time, her BP is elevated today, she does not have a PCP

## 2012-02-08 NOTE — ED Notes (Signed)
Pt reports SOB that started this morning and high BP. Reports she does not take any medication for BP. Reports headache this morning, reports SOB with activity.

## 2012-02-10 NOTE — ED Provider Notes (Signed)
I saw and evaluated the patient, reviewed the resident's note and I agree with the findings and plan.   Lyanne Co, MD 02/10/12 [phone]

## 2012-02-11 NOTE — ED Provider Notes (Signed)
Medical screening examination/treatment/procedure(s) were performed by resident physician or non-physician practitioner and as supervising physician I was immediately available for consultation/collaboration.   KINDL,JAMES DOUGLAS MD.    James D Kindl, MD 02/11/12 1854 

## 2012-03-23 ENCOUNTER — Ambulatory Visit (INDEPENDENT_AMBULATORY_CARE_PROVIDER_SITE_OTHER): Payer: Self-pay | Admitting: Family Medicine

## 2012-03-23 ENCOUNTER — Encounter: Payer: Self-pay | Admitting: Family Medicine

## 2012-03-23 VITALS — BP 154/81 | HR 105 | Temp 98.6°F | Ht 65.0 in | Wt 188.0 lb

## 2012-03-23 DIAGNOSIS — I10 Essential (primary) hypertension: Secondary | ICD-10-CM | POA: Insufficient documentation

## 2012-03-23 NOTE — Progress Notes (Signed)
  Subjective:    Patient ID: Stephanie Smith, female    DOB: 1945/07/03, 66 y.o.   MRN: [id number]  HPI New patient visit She was seen in ED end of October due to headaches attributed to high blood pressure (SBP 180s) She has no previous PCP  She denies headache since then She was advised to take Tylenol as needed She denies chest pain   Review of Systems  Allergies, medication, past medical history reviewed.      Objective:   Physical Exam GEN: NAD; well-nourished, -appearing PSYCH: normally socailly appropriate CV: tachycardic, normal S1/S2, no murmurs PULM: NI WOB; CTAB EXT: 1+ bilateral edema, nonpitting; wearing compression hose      Assessment & Plan:

## 2012-03-23 NOTE — Patient Instructions (Addendum)
Follow-up in 1 month Check your blood pressure at the pharmacy or where they have blood pressure monitoring Continue regular exercise See below regarding diet  DASH Diet The DASH diet stands for "Dietary Approaches to Stop Hypertension." It is a healthy eating plan that has been shown to reduce high blood pressure (hypertension) in as little as 14 days, while also possibly providing other significant health benefits. These other health benefits include reducing the risk of breast cancer after menopause and reducing the risk of type 2 diabetes, heart disease, colon cancer, and stroke. Health benefits also include weight loss and slowing kidney failure in patients with chronic kidney disease.  DIET GUIDELINES  Limit salt (sodium). Your diet should contain less than 1500 mg of sodium daily.  Limit refined or processed carbohydrates. Your diet should include mostly whole grains. Desserts and added sugars should be used sparingly.  Include small amounts of heart-healthy fats. These types of fats include nuts, oils, and tub margarine. Limit saturated and trans fats. These fats have been shown to be harmful in the body. CHOOSING FOODS  The following food groups are based on a 2000 calorie diet. See your Registered Dietitian for individual calorie needs. Grains and Grain Products (6 to 8 servings daily)  Eat More Often: Whole-wheat bread, brown rice, whole-grain or wheat pasta, quinoa, popcorn without added fat or salt (air popped).  Eat Less Often: White bread, white pasta, white rice, cornbread. Vegetables (4 to 5 servings daily)  Eat More Often: Fresh, frozen, and canned vegetables. Vegetables may be raw, steamed, roasted, or grilled with a minimal amount of fat.  Eat Less Often/Avoid: Creamed or fried vegetables. Vegetables in a cheese sauce. Fruit (4 to 5 servings daily)  Eat More Often: All fresh, canned (in natural juice), or frozen fruits. Dried fruits without added sugar. One hundred  percent fruit juice ( cup [237 mL] daily).  Eat Less Often: Dried fruits with added sugar. Canned fruit in light or heavy syrup. [employer], Fish, and Poultry (2 servings or less daily. One serving is 3 to 4 oz [85-114 g]).  Eat More Often: Ninety percent or leaner ground beef, tenderloin, sirloin. Round cuts of beef, chicken breast, [place] breast. All fish. Grill, bake, or broil your meat. Nothing should be fried.  Eat Less Often/Avoid: Fatty cuts of meat, [place], or chicken leg, thigh, or wing. Fried cuts of meat or fish. Dairy (2 to 3 servings)  Eat More Often: Low-fat or fat-free milk, low-fat plain or light yogurt, reduced-fat or part-skim cheese.  Eat Less Often/Avoid: Milk (whole, 2%).Whole milk yogurt. Full-fat cheeses. Nuts, Seeds, and Legumes (4 to 5 servings per week)  Eat More Often: All without added salt.  Eat Less Often/Avoid: Salted nuts and seeds, canned beans with added salt. Fats and Sweets (limited)  Eat More Often: Vegetable oils, tub margarines without trans fats, sugar-free gelatin. Mayonnaise and salad dressings.  Eat Less Often/Avoid: Coconut oils, palm oils, butter, stick margarine, cream, half and half, cookies, candy, pie. FOR MORE INFORMATION The Dash Diet Eating Plan: www.dashdiet.org Document Released: 03/19/2011 Document Revised: 06/22/2011 Document Reviewed: 03/19/2011 [facility] Patient Information 2013 [place], [place].

## 2012-03-23 NOTE — Assessment & Plan Note (Signed)
She was diagnosed in the ED end of October SBP 180s and having headaches and advised to follow-up with PCP. New patient visit today. She would like to avoid medications. BMET from ED visit reviewed, normal, including kidney function and glucose.  -Try DASH diet -Try regular exercise. She will try to walk 30 minutes 3-4 times a week around her block -Follow-up in 1 month. We discussed ECG changes showing LVH and her tachycardia may be causing stress on her heart. If her blood pressure is not improved in a month, she will consider medications -Consider checking TSH and lipid panel at next visit. She has no insurance and was given information about orange card today, and she declines these lab tests at this time.

## 2012-04-19 ENCOUNTER — Ambulatory Visit (INDEPENDENT_AMBULATORY_CARE_PROVIDER_SITE_OTHER): Payer: Self-pay | Admitting: Family Medicine

## 2012-04-19 VITALS — BP 181/99 | HR 82 | Temp 98.2°F | Ht 65.0 in | Wt 188.0 lb

## 2012-04-19 DIAGNOSIS — I10 Essential (primary) hypertension: Secondary | ICD-10-CM

## 2012-04-19 MED ORDER — HYDROCHLOROTHIAZIDE 25 MG PO TABS: 25.0000 mg | ORAL_TABLET | Freq: Every day | ORAL | Status: DC

## 2012-04-19 NOTE — Patient Instructions (Addendum)
Start blood pressure medication (hydrochlorothiazide, HCTZ for short)  Make 2 appointments: -Nurse visit in 1 week for blood pressure check -With Dr. Madolyn Frieze in 1 month  Please get orange card. We need to check basic lab work and cholesterol.

## 2012-04-19 NOTE — Assessment & Plan Note (Signed)
Persistently elevated. Start HCTZ 25 today. She was advised to apply for orange card since her Medicare won't be effective until summer 2014. We will check baseline labs BMET at that time. I would also like to check cholesterol. Follow-up in 1 week for nurse visit weight check and 4 weeks for follow-up of hypertension.

## 2012-04-19 NOTE — Progress Notes (Signed)
  Subjective:    Patient ID: Stephanie Smith, female    DOB: 06/15/1945, 67 y.o.   MRN: [id number]  HPI Follow-up of hypertension She has tried to decrease salt intake in her diet but has not made any other major changes and has not exercised  Review of Systems Denies chest pain, difficulty breathing Endorses chronic leg swelling. She wears compression hose.   Allergies, medication, past medical history reviewed.      Objective:   Physical Exam Gen: NAD; well-appearing PSYCH: pleasant, engaged and normally conversant, appropriate to questions, alert and oriented CV: RRR, normal S1/S2, no m/r/g PULM: NI WOB; CTAB without w/r/r ABD: soft, NT, ND EXT: 1+ bilateral pretibial pitting edema SKIN: warm, dry, no rash      Assessment & Plan:

## 2012-04-26 ENCOUNTER — Ambulatory Visit (INDEPENDENT_AMBULATORY_CARE_PROVIDER_SITE_OTHER): Payer: Self-pay | Admitting: *Deleted

## 2012-04-26 VITALS — BP 138/84 | HR 100

## 2012-04-26 DIAGNOSIS — I10 Essential (primary) hypertension: Secondary | ICD-10-CM

## 2012-04-26 NOTE — Progress Notes (Signed)
Patient in for BP check. Usually takes medication at 10:00 AM but forgot it this AM.  BP checked manually using regular adult cuff. BP LA 130/86 and RA 138/84 pulse 100. Patient denies andy problems with HCTZ. Has follow up appointment on 02/10 with  PCP.

## 2012-04-28 ENCOUNTER — Telehealth: Payer: Self-pay | Admitting: Family Medicine

## 2012-04-28 NOTE — Telephone Encounter (Signed)
States that the new medicine that she started last week has caused her left hand to go numb - not sure what to do.  Would like to speak to nurse but she is at work right now and would like a call after 3pm today

## 2012-04-28 NOTE — Telephone Encounter (Signed)
Patient states  this morning she woke up and four fingers on left  hand were numb. Numbess has gone away but she attributes it to the HCTZ and she did not take today. Consulted with  Dr. Earnest Bailey and she advises that numbess probably not due to HCTZ and she should make an appointment to be seen for this problem.  Advided she should continue taking medication.  Patient declines appointment and will take the medication but she just wants to let Dr. Madolyn Frieze know.

## 2012-05-23 ENCOUNTER — Ambulatory Visit (INDEPENDENT_AMBULATORY_CARE_PROVIDER_SITE_OTHER): Payer: PRIVATE HEALTH INSURANCE | Admitting: Family Medicine

## 2012-05-23 VITALS — BP 163/90 | HR 90 | Ht 65.0 in | Wt 188.0 lb

## 2012-05-23 DIAGNOSIS — I10 Essential (primary) hypertension: Secondary | ICD-10-CM

## 2012-05-23 NOTE — Assessment & Plan Note (Signed)
Remains elevated but she is not taking medication.  We spoke at length about increased cardiovascular mortality/morbidity with poorly controlled blood pressure, although she does not have symptoms at this time. She is aware of this and has full capacity and understanding of medical information.  So, we will try to maximize on lifestyle modification. Encouraged regular exercise, optimal weight, DASH diet.  Follow-up in 3 months. We will check cholesterol at that time (she does not have orange card yet but turned in paperwork today).

## 2012-05-23 NOTE — Patient Instructions (Addendum)
Exercise 30 minutes 5 times a week.  Continue DASH diet.  Eat a higher potassium diet.   Schedule a mammogram when you have your orange card.  When your Medicaid goes into effect in May, we will schedule your colonoscopy. Remind me at your next visit with me.   Follow-up in 3 months.   Foods Rich in Potassium Food / Potassium (mg)  Apricots, dried,  cup / 378 mg   Apricots, raw, 1 cup halves / 401 mg   Avocado,  / 487 mg   Banana, 1 large / 487 mg   Beef, lean, round, 3 oz / 202 mg   Cantaloupe, 1 cup cubes / 427 mg   Dates, medjool, 5 whole / 835 mg   Ham, cured, 3 oz / 212 mg   Lentils, dried,  cup / 458 mg   Lima beans, frozen,  cup / 258 mg   Orange, 1 large / 333 mg   Orange juice, 1 cup / 443 mg   Peaches, dried,  cup / 398 mg   Peas, split, cooked,  cup / 355 mg   Potato, boiled, 1 medium / 515 mg   Prunes, dried, uncooked,  cup / 318 mg   Raisins,  cup / 309 mg   Salmon, pink, raw, 3 oz / 275 mg   Sardines, canned , 3 oz / 338 mg   Tomato, raw, 1 medium / 292 mg   Tomato juice, 6 oz / 417 mg   [place], 3 oz / 349 mg  Document Released: 03/30/2005 Document Revised: 12/10/2010 Document Reviewed: 08/13/2008 [facility] Patient Information 2012 Stephanie Smith, Stephanie Smith.

## 2012-05-23 NOTE — Progress Notes (Signed)
  Subjective:    Patient ID: Stephanie Smith, female    DOB: 05/11/1945, 67 y.o.   MRN: [id number]  HPI # Follow-up of hypertension She is not taking HCTZ. She does not want to take medications.  ROS: denies chest pain, difficulty breathing, headache, leg swelling; she has occasional tingling in her fingertips that last for about 30 seconds and occur every several days   Review of Systems  Allergies, medication, past medical history reviewed.  Smoking status noted.     Objective:   Physical Exam GEN: NAD; well-nourished, -appearing CV: RRR, no murmurs PULM: NI WOB; CTAB  EXT: no edema PSYCH: alert and oriented, appropriate to questions, pleasant NEURO: moves all extremities well    Assessment & Plan:

## 2012-06-21 ENCOUNTER — Other Ambulatory Visit: Payer: Self-pay | Admitting: Family Medicine

## 2012-09-20 ENCOUNTER — Other Ambulatory Visit: Payer: Self-pay | Admitting: Family Medicine

## 2012-09-21 ENCOUNTER — Other Ambulatory Visit: Payer: Self-pay | Admitting: Family Medicine

## 2015-01-25 ENCOUNTER — Encounter: Payer: Self-pay | Admitting: Family Medicine

## 2015-01-25 ENCOUNTER — Ambulatory Visit (INDEPENDENT_AMBULATORY_CARE_PROVIDER_SITE_OTHER): Payer: Medicare HMO | Admitting: Family Medicine

## 2015-01-25 VITALS — BP 161/70 | HR 91 | Temp 98.3°F | Ht 65.0 in | Wt 178.3 lb

## 2015-01-25 DIAGNOSIS — M1712 Unilateral primary osteoarthritis, left knee: Secondary | ICD-10-CM | POA: Diagnosis not present

## 2015-01-25 MED ORDER — IBUPROFEN 600 MG PO TABS: 600.0000 mg | ORAL_TABLET | Freq: Three times a day (TID) | ORAL | Status: AC | PRN

## 2015-01-25 NOTE — Progress Notes (Signed)
    Subjective: CC:L knee pain HPI: Patient is a 69 y.o. female presenting to clinic today for same day appt. Concerns today include:  1. L knee pain Patient reports that pain started about 2 years ago.   Pain is intermittent in nature, flaring about every 3 months.  Flares last about 7-10 days.  It is described as dull ache, occ sharp with standing.  Standing makes pain worse.  Not standing seems makes it better.  Motrin also seems to relieve this some.  Alternating with hot and cold also improve symptoms.  She denies trauma, falls, weakness, numbness or tingling.  Occ swells and gets hot.  Social History Reviewed: non smoker. FamHx and MedHx updated.  Please see EMR. Health Maintenance: Flu shot declined.  ROS: Per HPI  Objective: Office vital signs reviewed. BP 161/70 mmHg  Pulse 91  Temp(Src) 98.3 F (36.8 C) (Oral)  Ht 5\' 5"  (1.651 m)  Wt 178 lb 4.8 oz (80.876 kg)  BMI 29.67 kg/m2  Physical Examination:  General: Awake, alert, well nourished, NAD HEENT: Normal, MMM Extremities: WWP, No edema, cyanosis or clubbing; +2 PT pulses bilaterally  LLE: knee without joint effusion, L knee slightly warmer than R (a large area of multiple varicose veins present at area of warmth), FROM, no joint line TTP, negative ACL, PCL, MCL, LCL laxity, negative McMurray's, negative J sign, moderate osteoarthritic bony changes MSK: Normal gait and station Skin: dry, intact, no rashes, cluster of vericosities as above Neuro: Strength and sensation grossly intact, patellar DTRs 1/4  Assessment/ Plan: 69 y.o. female with  1. Primary osteoarthritis of left knee.  Exam essentially benign.  No evidence of effusion, infection or gout at this time.  Of note, patient is also asx at this time.  DDx: Gout flare.  Low suspicion for joint infection. - Reassurance - ibuprofen (ADVIL,MOTRIN) 600 MG tablet; Take 1 tablet (600 mg total) by mouth every 8 (eight) hours as needed.  Dispense: 30 tablet; Refill:  1 - Return Precautions reviewed - Follow up with PCP for annual PE   Yazleen Molock Hulen SkainsM Guido Comp, DO PGY-2, [facility]

## 2015-01-25 NOTE — Patient Instructions (Addendum)
I think that this is osteoarthritis.  You can take the Motrin I have prescribed you up to 3 times daily during these flares.  Take this medication with food.  If you develop fevers, severe pain, or weakness, seek immediate medical attention.  Follow up with Dr Waynetta SandyWight as needed. Osteoarthritis Osteoarthritis is a disease that causes soreness and inflammation of a joint. It occurs when the cartilage at the affected joint wears down. Cartilage acts as a cushion, covering the ends of bones where they meet to form a joint. Osteoarthritis is the most common form of arthritis. It often occurs in older people. The joints affected most often by this condition include those in the:  Ends of the fingers.  Thumbs.  Neck.  Lower back.  Knees.  Hips. CAUSES  Over time, the cartilage that covers the ends of bones begins to wear away. This causes bone to rub on bone, producing pain and stiffness in the affected joints.  RISK FACTORS Certain factors can increase your chances of having osteoarthritis, including:  Older age.  Excessive body weight.  Overuse of joints.  Previous joint injury. SIGNS AND SYMPTOMS   Pain, swelling, and stiffness in the joint.  Over time, the joint may lose its normal shape.  Small deposits of bone (osteophytes) may grow on the edges of the joint.  Bits of bone or cartilage can break off and float inside the joint space. This may cause more pain and damage. DIAGNOSIS  Your health care provider will do a physical exam and ask about your symptoms. Various tests may be ordered, such as:  X-rays of the affected joint.  Blood tests to rule out other types of arthritis. Additional tests may be used to diagnose your condition. TREATMENT  Goals of treatment are to control pain and improve joint function. Treatment plans may include:  A prescribed exercise program that allows for rest and joint relief.  A weight control plan.  Pain relief techniques, such  as:  Properly applied heat and cold.  Electric pulses delivered to nerve endings under the skin (transcutaneous electrical nerve stimulation [TENS]).  Massage.  Certain nutritional supplements.  Medicines to control pain, such as:  Acetaminophen.  Nonsteroidal anti-inflammatory drugs (NSAIDs), such as naproxen.  Narcotic or central-acting agents, such as tramadol.  Corticosteroids. These can be given orally or as an injection.  Surgery to reposition the bones and relieve pain (osteotomy) or to remove loose pieces of bone and cartilage. Joint replacement may be needed in advanced states of osteoarthritis. HOME CARE INSTRUCTIONS   Take medicines only as directed by your health care provider.  Maintain a healthy weight. Follow your health care provider's instructions for weight control. This may include dietary instructions.  Exercise as directed. Your health care provider can recommend specific types of exercise. These may include:  Strengthening exercises. These are done to strengthen the muscles that support joints affected by arthritis. They can be performed with weights or with exercise bands to add resistance.  Aerobic activities. These are exercises, such as brisk walking or low-impact aerobics, that get your heart pumping.  Range-of-motion activities. These keep your joints limber.  Balance and agility exercises. These help you maintain daily living skills.  Rest your affected joints as directed by your health care provider.  Keep all follow-up visits as directed by your health care provider. SEEK MEDICAL CARE IF:   Your skin turns red.  You develop a rash in addition to your joint pain.  You have worsening joint  pain.  You have a fever along with joint or muscle aches. SEEK IMMEDIATE MEDICAL CARE IF:  You have a significant loss of weight or appetite.  You have night sweats. FOR MORE INFORMATION   National Institute of Arthritis and Musculoskeletal and  Skin Diseases: www.niams.http://www.myers.net/  [employer] on Aging: https://walker.com/  American College of Rheumatology: www.rheumatology.org   This information is not intended to replace advice given to you by your health care provider. Make sure you discuss any questions you have with your health care provider.   Document Released: 03/30/2005 Document Revised: 04/20/2014 Document Reviewed: 12/05/2012 Elsevier Interactive Patient Education [employer]

## 2015-03-12 ENCOUNTER — Encounter: Payer: Medicare HMO | Admitting: Family Medicine

## 2015-06-04 ENCOUNTER — Telehealth: Payer: Self-pay | Admitting: *Deleted

## 2015-06-04 NOTE — Telephone Encounter (Signed)
Called patient to offer flu vaccine.  Patient's daughter answered, stated patient does not get flu vaccines. Asked to have patient return call if she would like to receive flu vaccine. Fredderick Severance, RN

## 2015-10-22 ENCOUNTER — Encounter (HOSPITAL_COMMUNITY): Payer: Self-pay | Admitting: *Deleted

## 2015-10-22 ENCOUNTER — Observation Stay (HOSPITAL_COMMUNITY)
Admission: EM | Admit: 2015-10-22 | Discharge: 2015-10-24 | Disposition: A | Discharge status: Home / Self Care | Payer: Medicare HMO | Attending: Family Medicine | Admitting: Family Medicine

## 2015-10-22 ENCOUNTER — Emergency Department (HOSPITAL_COMMUNITY): Payer: Medicare HMO

## 2015-10-22 DIAGNOSIS — E669 Obesity, unspecified: Secondary | ICD-10-CM | POA: Insufficient documentation

## 2015-10-22 DIAGNOSIS — R072 Precordial pain: Principal | ICD-10-CM | POA: Insufficient documentation

## 2015-10-22 DIAGNOSIS — Z131 Encounter for screening for diabetes mellitus: Secondary | ICD-10-CM | POA: Diagnosis not present

## 2015-10-22 DIAGNOSIS — R079 Chest pain, unspecified: Secondary | ICD-10-CM | POA: Diagnosis present

## 2015-10-22 DIAGNOSIS — R0602 Shortness of breath: Secondary | ICD-10-CM | POA: Diagnosis not present

## 2015-10-22 DIAGNOSIS — I119 Hypertensive heart disease without heart failure: Secondary | ICD-10-CM | POA: Insufficient documentation

## 2015-10-22 DIAGNOSIS — R0600 Dyspnea, unspecified: Secondary | ICD-10-CM | POA: Insufficient documentation

## 2015-10-22 LAB — BASIC METABOLIC PANEL
Anion gap: 4 — ABNORMAL LOW (ref 5–15)
BUN: 11 mg/dL (ref 6–20)
CHLORIDE: 107 mmol/L (ref 101–111)
CO2: 30 mmol/L (ref 22–32)
CREATININE: 0.85 mg/dL (ref 0.44–1.00)
Calcium: 9.6 mg/dL (ref 8.9–10.3)
GFR calc Af Amer: >60 mL/min (ref >60)
GFR calc non Af Amer: >60 mL/min (ref >60)
Glucose, Bld: 89 mg/dL (ref 65–99)
POTASSIUM: 4 mmol/L (ref 3.5–5.1)
SODIUM: 141 mmol/L (ref 135–145)

## 2015-10-22 LAB — BRAIN NATRIURETIC PEPTIDE: B NATRIURETIC PEPTIDE 5: 41.2 pg/mL (ref 0.0–100.0)

## 2015-10-22 LAB — CBC
HEMATOCRIT: 41.3 % (ref 36.0–46.0)
Hemoglobin: 13.6 g/dL (ref 12.0–15.0)
MCH: 31.8 pg (ref 26.0–34.0)
MCHC: 32.9 g/dL (ref 30.0–36.0)
MCV: 96.5 fL (ref 78.0–100.0)
Platelets: 300 10*3/uL (ref 150–400)
RBC: 4.28 MIL/uL (ref 3.87–5.11)
RDW: 12.7 % (ref 11.5–15.5)
WBC: 7.3 10*3/uL (ref 4.0–10.5)

## 2015-10-22 LAB — I-STAT TROPONIN, ED: Troponin i, poc: 0 ng/mL (ref 0.00–0.08)

## 2015-10-22 NOTE — ED Notes (Signed)
Attempted to [facility].  Nurse will call back.

## 2015-10-22 NOTE — ED Notes (Signed)
Pt complains of shortness of breath and heaviness in chest since Sunday. Pt denies cough. Pt states she has felt fatigued since Sunday.

## 2015-10-22 NOTE — ED Notes (Signed)
Lab called to add on BNP 

## 2015-10-22 NOTE — ED Notes (Signed)
Report given to [facility]. 

## 2015-10-22 NOTE — ED Notes (Signed)
Report given to RiverwoodVanessa, RN on [facility].  Pt will be going to [facility] room 21.

## 2015-10-22 NOTE — ED Provider Notes (Signed)
Emergency Department Provider Note  Time seen: Approximately 7:57 PM  I have reviewed the triage vital signs and the nursing notes.   HISTORY  Chief Complaint Shortness of Breath and Chest Pain   HPI Stephanie Smith is a 70 y.o. female with PMH of HTN presents to the emergency department with worsening dyspnea on exertion and chest pressure. She reports symptoms for the past 3 days. Denies productive cough, fever, chills. No similar symptoms in the past. No history of heart attack or stroke. She does have a strong family history. No tobacco use. No history of clots in her legs or lungs. No known history of asthma or COPD. Symptoms got much worse on Sunday and it worsened throughout the early part of the week which prompted the emergency department visit. She has tried rest without change in symptoms.   History reviewed. No pertinent past medical history.  Patient Active Problem List   Diagnosis Date Noted  . Hypertension 03/23/2012    Past Surgical History  Procedure Laterality Date  . Abdominal hysterectomy      Current Outpatient Rx  Name  Route  Sig  Dispense  Refill  . ibuprofen (ADVIL,MOTRIN) 600 MG tablet   Oral   Take 1 tablet (600 mg total) by mouth every 8 (eight) hours as needed.   30 tablet   1     Allergies Aspirin and Morphine  No family history on file.  Social History Social History  Substance Use Topics  . Smoking status: Never Smoker   . Smokeless tobacco: None  . Alcohol Use: No    Review of Systems  Constitutional: No fever/chills Eyes: No visual changes. ENT: No sore throat. Cardiovascular: Positive chest pain pressure Respiratory: Positive  shortness of breath. Gastrointestinal: No abdominal pain.  No nausea, no vomiting.  No diarrhea.  No constipation. Genitourinary: Negative for dysuria. Musculoskeletal: Negative for back pain. Skin: Negative for rash. Neurological: Negative for headaches, focal weakness or numbness.  10-point  ROS otherwise negative.  ____________________________________________   PHYSICAL EXAM:  VITAL SIGNS: ED Triage Vitals  Enc Vitals Group     BP 10/22/15 1606 157/90 mmHg     Pulse Rate 10/22/15 1606 82     Resp 10/22/15 1606 18     Temp 10/22/15 1606 98.8 F (37.1 C)     Temp Source 10/22/15 1606 Oral     SpO2 10/22/15 1606 99 %     Pain Score 10/22/15 1610 4    Constitutional: Alert and oriented. Well appearing and in no acute distress. Eyes: Conjunctivae are normal. PERRL.  Head: Atraumatic. Nose: No congestion/rhinnorhea. Mouth/Throat: Mucous membranes are moist.  Oropharynx non-erythematous. Neck: No stridor.  Cardiovascular: Normal rate, regular rhythm. Good peripheral circulation. Grossly normal heart sounds.   Respiratory: Normal respiratory effort.  No retractions. Lungs CTAB. Gastrointestinal: Soft and nontender. No distention.  Musculoskeletal: No lower extremity tenderness. Trace pitting edema in bilateral LE. No gross deformities of extremities. Neurologic:  Normal speech and language. No gross focal neurologic deficits are appreciated.  Skin:  Skin is warm, dry and intact. No rash noted. Psychiatric: Mood and affect are normal. Speech and behavior are normal.  ____________________________________________   LABS (all labs ordered are listed, but only abnormal results are displayed)  Labs Reviewed  BASIC METABOLIC PANEL - Abnormal; Notable for the following:    Anion gap 4 (*)    All other components within normal limits  CBC  BRAIN NATRIURETIC PEPTIDE  I-STAT TROPOININ, ED   ____________________________________________  EKG  Reviewed in MUSE.  ____________________________________________  RADIOLOGY  Dg Chest 2 View  10/22/2015  CLINICAL DATA:  Chest heaviness and shortness of breath for the past 3 days. EXAM: CHEST  2 VIEW COMPARISON:  02/08/2012. FINDINGS: Poor inspiration. The cardiac silhouette remains borderline enlarged. Clear lungs with normal  vascularity. Mild thoracic spine degenerative changes. IMPRESSION: No acute abnormality. Electronically Signed   By: Beckie SaltsSteven  Reid M.D.   On: 10/22/2015 16:32    ____________________________________________   PROCEDURES  Procedure(s) performed:   Procedures  None ____________________________________________   INITIAL IMPRESSION / ASSESSMENT AND PLAN / ED COURSE  Pertinent labs & imaging results that were available during my care of the patient were reviewed by me and considered in my medical decision making (see chart for details).  Patient presents to the emergency department for evaluation of exertional dyspnea and chest pressure. Exam is nonspecific. HEART score 5. Patient is not currently having chest pressure.   Differential includes all life-threatening causes for chest pain. This includes but is not exclusive to acute coronary syndrome, aortic dissection, pulmonary embolism, cardiac tamponade, community-acquired pneumonia, pericarditis, musculoskeletal chest wall pain, etc.  Plan for biomarker trending and admission for chest pain r/o for ACS.   Discussed patient's case with [facility], Dr. Jimmey RalphParker.  Recommend admission to observation, telemetry bed.  I will place holding orders per their request. Patient and family (if present) updated with plan. [facility].  I reviewed all nursing notes, vitals, pertinent old records, EKGs, labs, imaging (as available).    ____________________________________________  FINAL CLINICAL IMPRESSION(S) / ED DIAGNOSES  Final diagnoses:  Dyspnea  Chest pain, unspecified chest pain type     MEDICATIONS GIVEN DURING THIS VISIT:  Medications  acetaminophen (TYLENOL) tablet 650 mg (not administered)  ondansetron (ZOFRAN) injection 4 mg (not administered)  enoxaparin (LOVENOX) injection 40 mg (not administered)  gi cocktail (Maalox,Lidocaine,Donnatal) (not administered)     NEW OUTPATIENT MEDICATIONS  STARTED DURING THIS VISIT:  None   Note:  This document was prepared using Dragon voice recognition software and may include unintentional dictation errors.  Alona BeneJoshua Long, MD Emergency Medicine  Maia PlanJoshua G Long, MD 10/23/15 1016

## 2015-10-22 NOTE — ED Notes (Signed)
Pt ambulated to restroom. 

## 2015-10-23 ENCOUNTER — Encounter (HOSPITAL_COMMUNITY)
Admission: EM | Disposition: A | Discharge status: Home / Self Care | Payer: Self-pay | Source: Home / Self Care | Attending: Emergency Medicine

## 2015-10-23 DIAGNOSIS — R079 Chest pain, unspecified: Secondary | ICD-10-CM | POA: Diagnosis not present

## 2015-10-23 DIAGNOSIS — R0600 Dyspnea, unspecified: Secondary | ICD-10-CM | POA: Diagnosis not present

## 2015-10-23 DIAGNOSIS — I119 Hypertensive heart disease without heart failure: Secondary | ICD-10-CM | POA: Diagnosis not present

## 2015-10-23 DIAGNOSIS — R0602 Shortness of breath: Secondary | ICD-10-CM | POA: Diagnosis not present

## 2015-10-23 DIAGNOSIS — R072 Precordial pain: Secondary | ICD-10-CM | POA: Diagnosis not present

## 2015-10-23 DIAGNOSIS — E669 Obesity, unspecified: Secondary | ICD-10-CM | POA: Diagnosis not present

## 2015-10-23 HISTORY — PX: CARDIAC CATHETERIZATION: SHX172

## 2015-10-23 LAB — LIPID PANEL
CHOL/HDL RATIO: 2.7 ratio
Cholesterol: 143 mg/dL (ref 0–200)
HDL: 53 mg/dL (ref >40)
LDL Cholesterol: 78 mg/dL (ref 0–99)
Triglycerides: 59 mg/dL (ref <150)
VLDL: 12 mg/dL (ref 0–40)

## 2015-10-23 LAB — TSH: TSH: 3.913 u[IU]/mL (ref 0.350–4.500)

## 2015-10-23 LAB — TROPONIN I
Troponin I: <0.03 ng/mL (ref <0.03)
Troponin I: <0.03 ng/mL (ref <0.03)
Troponin I: 0.03 ng/mL (ref <0.03)

## 2015-10-23 LAB — HEMOGLOBIN A1C
HEMOGLOBIN A1C: 5.5 % (ref 4.8–5.6)
MEAN PLASMA GLUCOSE: 111 mg/dL

## 2015-10-23 LAB — MRSA PCR SCREENING: MRSA by PCR: NEGATIVE

## 2015-10-23 SURGERY — LEFT HEART CATH AND CORONARY ANGIOGRAPHY
Anesthesia: LOCAL

## 2015-10-23 MED ORDER — LIDOCAINE HCL (PF) 1 % IJ SOLN
INTRAMUSCULAR | Status: AC
  Filled 2015-10-23: qty 30

## 2015-10-23 MED ORDER — GI COCKTAIL ~~LOC~~: 30.0000 mL | Freq: Four times a day (QID) | ORAL | Status: DC | PRN

## 2015-10-23 MED ORDER — VERAPAMIL HCL 2.5 MG/ML IV SOLN
INTRA_ARTERIAL | Status: DC | PRN
  Administered 2015-10-23: 10 mL via INTRA_ARTERIAL

## 2015-10-23 MED ORDER — LIDOCAINE HCL (PF) 1 % IJ SOLN
INTRAMUSCULAR | Status: DC | PRN
  Administered 2015-10-23: 3 mL via INTRADERMAL

## 2015-10-23 MED ORDER — ENOXAPARIN SODIUM 40 MG/0.4ML ~~LOC~~ SOLN
40.0000 mg | Freq: Every day | SUBCUTANEOUS | Status: DC
  Filled 2015-10-23: qty 0.4

## 2015-10-23 MED ORDER — SODIUM CHLORIDE 0.9 % WEIGHT BASED INFUSION
3.0000 mL/kg/h | INTRAVENOUS | Status: DC
  Administered 2015-10-23: 3 mL/kg/h via INTRAVENOUS

## 2015-10-23 MED ORDER — SODIUM CHLORIDE 0.9% FLUSH
3.0000 mL | Freq: Two times a day (BID) | INTRAVENOUS | Status: DC
  Administered 2015-10-23: 3 mL via INTRAVENOUS

## 2015-10-23 MED ORDER — MIDAZOLAM HCL 2 MG/2ML IJ SOLN
INTRAMUSCULAR | Status: DC | PRN
  Administered 2015-10-23: 1 mg via INTRAVENOUS

## 2015-10-23 MED ORDER — IOPAMIDOL (ISOVUE-370) INJECTION 76%
INTRAVENOUS | Status: DC | PRN
  Administered 2015-10-23: 60 mL via INTRA_ARTERIAL

## 2015-10-23 MED ORDER — ASPIRIN 81 MG PO CHEW
81.0000 mg | CHEWABLE_TABLET | ORAL | Status: AC
  Administered 2015-10-23: 81 mg via ORAL
  Filled 2015-10-23: qty 1

## 2015-10-23 MED ORDER — MIDAZOLAM HCL 2 MG/2ML IJ SOLN
INTRAMUSCULAR | Status: AC
  Filled 2015-10-23: qty 2

## 2015-10-23 MED ORDER — IOPAMIDOL (ISOVUE-370) INJECTION 76%
INTRAVENOUS | Status: AC
  Filled 2015-10-23: qty 100

## 2015-10-23 MED ORDER — NITROGLYCERIN 1 MG/10 ML FOR IR/CATH LAB
INTRA_ARTERIAL | Status: AC
  Filled 2015-10-23: qty 10

## 2015-10-23 MED ORDER — SODIUM CHLORIDE 0.9 % WEIGHT BASED INFUSION: 1.0000 mL/kg/h | INTRAVENOUS | Status: DC

## 2015-10-23 MED ORDER — HEPARIN (PORCINE) IN NACL 2-0.9 UNIT/ML-% IJ SOLN
INTRAMUSCULAR | Status: DC | PRN
  Administered 2015-10-23: 1000 mL

## 2015-10-23 MED ORDER — ACETAMINOPHEN 325 MG PO TABS
650.0000 mg | ORAL_TABLET | ORAL | Status: DC | PRN
  Administered 2015-10-23: 650 mg via ORAL
  Filled 2015-10-23 (×2): qty 2

## 2015-10-23 MED ORDER — HEPARIN SODIUM (PORCINE) 1000 UNIT/ML IJ SOLN
INTRAMUSCULAR | Status: DC | PRN
  Administered 2015-10-23: 4500 [IU] via INTRAVENOUS

## 2015-10-23 MED ORDER — ONDANSETRON HCL 4 MG/2ML IJ SOLN
4.0000 mg | Freq: Four times a day (QID) | INTRAMUSCULAR | Status: DC | PRN
  Filled 2015-10-23: qty 2

## 2015-10-23 MED ORDER — HEPARIN (PORCINE) IN NACL 2-0.9 UNIT/ML-% IJ SOLN
INTRAMUSCULAR | Status: AC
  Filled 2015-10-23: qty 1000

## 2015-10-23 MED ORDER — SODIUM CHLORIDE 0.9% FLUSH: 3.0000 mL | INTRAVENOUS | Status: DC | PRN

## 2015-10-23 MED ORDER — SODIUM CHLORIDE 0.9 % IV SOLN: 250.0000 mL | INTRAVENOUS | Status: DC | PRN

## 2015-10-23 MED ORDER — VERAPAMIL HCL 2.5 MG/ML IV SOLN
INTRAVENOUS | Status: AC
  Filled 2015-10-23: qty 2

## 2015-10-23 MED ORDER — HYDROMORPHONE HCL 1 MG/ML IJ SOLN
INTRAMUSCULAR | Status: AC
  Filled 2015-10-23: qty 1

## 2015-10-23 MED ORDER — HYDROMORPHONE HCL 1 MG/ML IJ SOLN
INTRAMUSCULAR | Status: DC | PRN
  Administered 2015-10-23: 0.5 mg via INTRAVENOUS

## 2015-10-23 SURGICAL SUPPLY — 10 items
CATH INFINITI JR4 5F (CATHETERS) ×2 IMPLANT
CATH OPTITORQUE TIG 4.0 5F (CATHETERS) ×2 IMPLANT
DEVICE RAD COMP TR BAND LRG (VASCULAR PRODUCTS) ×2 IMPLANT
GLIDESHEATH SLEND A-KIT 6F 20G (SHEATH) ×2 IMPLANT
GUIDEWIRE ANGLED .035X150CM (WIRE) ×2 IMPLANT
KIT HEART LEFT (KITS) ×2 IMPLANT
PACK CARDIAC CATHETERIZATION (CUSTOM PROCEDURE TRAY) ×2 IMPLANT
TRANSDUCER W/STOPCOCK (MISCELLANEOUS) ×2 IMPLANT
TUBING CIL FLEX 10 FLL-RA (TUBING) ×2 IMPLANT
WIRE SAFE-T 1.5MM-J .035X260CM (WIRE) ×2 IMPLANT

## 2015-10-23 NOTE — Interval H&P Note (Signed)
History and Physical Interval Note:  10/23/2015 4:08 PM  Stephanie Smith  has presented today for surgery, with the diagnosis of cp  The various methods of treatment have been discussed with the patient and family. After consideration of risks, benefits and other options for treatment, the patient has consented to  Procedure(s): Left Heart Cath and Coronary Angiography (N/A) and possible PCI as a surgical intervention .  The patient's history has been reviewed, patient examined, no change in status, stable for surgery.  I have reviewed the patient's chart and labs.  Questions were answered to the patient's satisfaction.   Cath Lab Visit (complete for each Cath Lab visit)  Clinical Evaluation Leading to the Procedure:   ACS: Yes.    Non-ACS:    Anginal Classification: CCS III  Anti-ischemic medical therapy: No Therapy  Non-Invasive Test Results: No non-invasive testing performed  Prior CABG: No previous CABG        Yates DecampGANJI, Michel Eskelson

## 2015-10-23 NOTE — ED Notes (Signed)
Pt independently ambulated to restroom. 

## 2015-10-23 NOTE — Progress Notes (Signed)
CRITICAL VALUE ALERT  Critical value received:  Troponin 0.03  Date of notification:  10/23/15  Time of notification:  1930  Critical value read back: Yes  Nurse who received alert:  Pearline CablesVanessa M R.N  MD notified (1st page):  Dr. Jacinto HalimGanji  Time of first page:  1935  MD notified (2nd page):  Time of second page:  Responding MD:  Dr. Jacinto HalimGanji  Time MD responded:  [phone]

## 2015-10-23 NOTE — H&P (Signed)
Family Medicine Teaching [facility] Admission History and Physical Service Pager: [phone]  Patient name: Stephanie Smith Medical record number: [id number] Date of birth: 10-Dec-1945 Age: 70 y.o. Gender: female  Primary Care Provider: Palma Holter, MD Consultants: Cardiology Code Status: Full  Chief Complaint: Chest pain  Assessment and Plan: Stephanie Smith is a 70 y.o. female presenting with Chest pain. PMH is significant for HTN.  Chest Pain. Heart score 4. Admitted for ACS rule out. Initial EKG and trop negative. Concern for cardiac etiology given exertional nature. CXR without signs of pneumonia. Wells score 0. Does not seem to have MSK or GI component.  - Trend troponin x3, - Recheck EKG - Risk labs: FLP, A1c, TSH - Consult cardiology - Consider starting ASA  while inpatient  HTN. Not currently on any medications. 120s-150s/70s-80s on admission. - Continue to monitor - Start antihypertensives as needed.   FEN/GI: Heart Healthy diet, SLIV Prophylaxis: Lovenox  Disposition: Admit for observation pending above management.   History of Present Illness:  Stephanie Smith is a 70 y.o. female presenting with chest pain.   Patient noticed substernal chest pain starting about 3-4 days ago. Pain described as a pressure like sensation in the middle of her chest that does not radiate. Pain worsened over the past day so the patient presented to the ED for further evaluation. Pain is sometimes worse with exertion and improved with rest. No history of heart attack. No history of PE.   No current chest pain - has not had any further episodes since last night at [facility] ED.   No shortness of breath. No cough. No fevers or chills. No nausea or vomiting.   Review Of Systems: Per HPI, Otherwise the remainder of the systems were negative.  Patient Active Problem List   Diagnosis Date Noted  . Chest pain 10/22/2015  . Hypertension 03/23/2012    Past Medical History: History  reviewed. No pertinent past medical history.  Past Surgical History: Past Surgical History  Procedure Laterality Date  . Abdominal hysterectomy      Social History: Social History  Substance Use Topics  . Smoking status: Never Smoker   . Smokeless tobacco: None  . Alcohol Use: No   Additional social history: None  Please also refer to relevant sections of EMR.  Family History: Family history of heart disease.   Allergies and Medications: Allergies  Allergen Reactions  . Morphine Anaphylaxis  . Aspirin Nausea And Vomiting   No current facility-administered medications on file prior to encounter.   Current Outpatient Prescriptions on File Prior to Encounter  Medication Sig Dispense Refill  . ibuprofen (ADVIL,MOTRIN) 600 MG tablet Take 1 tablet (600 mg total) by mouth every 8 (eight) hours as needed. (Patient not taking: Reported on 10/22/2015) 30 tablet 1   Objective: BP 163/70 mmHg  Pulse 62  Temp(Src) 98.5 F (36.9 C) (Oral)  Resp 16  Ht  (1.651 m)  Wt 191 lb 3.2 oz (86.728 kg)  BMI 31.82 kg/m2  SpO2 100% Exam: General: Pleasant 70 year old female in NAD lying in hospital bed Eyes: PERRL, EOMI ENTM: MMM, O/P clear Neck: supple, FROM Cardiovascular: RRR, no murmurs noted Respiratory: NWOB, CTAB Abdomen: S, NT, ND MSK: Mild 1+ edema to knees (chronic finding per patient). No cyanosis Skin: Warm, dry Neuro: Alert and conversational. No focal neurological deficits.  Psych: Appropriate mood and affect.   Labs and Imaging: CBC BMET   Recent Labs Lab 10/22/15 1639  WBC 7.3  HGB  13.6  HCT 41.3  PLT 300    Recent Labs Lab 10/22/15 1639  NA 141  K 4.0  CL 107  CO2 30  BUN 11  CREATININE 0.85  GLUCOSE 89  CALCIUM 9.6     BNP 41.2 Trop 0  EKG: NSR, no acute ischemic changes  Dg Chest 2 View  10/22/2015  CLINICAL DATA:  Chest heaviness and shortness of breath for the past 3 days. EXAM: CHEST  2 VIEW COMPARISON:  02/08/2012. FINDINGS: Poor  inspiration. The cardiac silhouette remains borderline enlarged. Clear lungs with normal vascularity. Mild thoracic spine degenerative changes. IMPRESSION: No acute abnormality. Electronically Signed   By: Beckie SaltsSteven  Reid M.D.   On: 10/22/2015 16:32   Ardith Darkaleb M Kendell Gammon, MD 10/23/2015, 6:16 AM PGY-2, [facility] FPTS Intern pager: [phone], text pages welcome

## 2015-10-23 NOTE — Consult Note (Signed)
CARDIOLOGY CONSULT NOTE  Patient ID: Stephanie Smith MRN: [id number] DOB/AGE: 07/20/1945 70 y.o.  Admit date: 10/22/2015 Referring Physician  Caleb Parker, MD Primary Physician:  Kanishka G Gunadasa, MD Reason for Consultation  Unstable angian  HPI: Stephanie Smith  is a 70 y.o. female  With Jeff hypertension, presently not on any medical therapy, no history of diabetes, tobacco use disorder or hyperlipidemia, who had been doing well until past Sunday when she started experiencing exertional chest tightness in the middle of the chest with radiation to her neck. Symptoms would easily get resolved with resting.  She was not much concerned about the symptoms as they were easily getting relieved, however yesterday all day she started having chest tightness with radiation to the neck associated with marked dyspnea even doing minimal activities around the house. Again each time she rested the symptoms would subside spontaneously. She denies any long travel, denies leg edema, no recent change in weight. Her daughter is present the bedside. Denies any neurologic deficits, denies dizziness or syncope, denies hemoptysis. No symptoms to suggest TIA or claudication.  History reviewed. No pertinent past medical history.   Past Surgical History  Procedure Laterality Date  . Abdominal hysterectomy       No family history on file.   Social History: Social History   Social History  . Marital Status: Divorced    Spouse Name: N/A  . Number of Children: N/A  . Years of Education: N/A   Occupational History  . Not on file.   Social History Main Topics  . Smoking status: Never Smoker   . Smokeless tobacco: Not on file  . Alcohol Use: No  . Drug Use: No  . Sexual Activity: Not on file   Other Topics Concern  . Not on file   Social History Narrative     Prescriptions prior to admission  Medication Sig Dispense Refill Last Dose  . cholecalciferol (VITAMIN D) 1000 units tablet Take 1,000 Units by  mouth daily.   Past Week at Unknown time  . ciprofloxacin (CIPRO) 250 MG tablet Take 250 mg by mouth every 12 (twelve) hours.   10/22/2015 at Unknown time  . vitamin E 400 UNIT capsule Take 400 Units by mouth daily.   Past Week at Unknown time  . ibuprofen (ADVIL,MOTRIN) 600 MG tablet Take 1 tablet (600 mg total) by mouth every 8 (eight) hours as needed. (Patient not taking: Reported on 10/22/2015) 30 tablet 1 Not Taking at Unknown time   ROS: General: no fevers/chills/night sweats Eyes: no blurry vision, diplopia, or amaurosis ENT: no sore throat or hearing loss Resp: Dyspnea present. No cough, wheezing, or hemoptysis CV: Chest pain present, no edema or palpitations GI: no abdominal pain, nausea, vomiting, diarrhea, or constipation GU: no dysuria, frequency, or hematuria Skin: no rash Neuro: no headache, numbness, tingling, or weakness of extremities Musculoskeletal: no joint pain or swelling Heme: no bleeding, DVT, or easy bruising Endo: no polydipsia or polyuria    Physical Exam: Blood pressure 163/70, pulse 62, temperature 98.5 F (36.9 C), temperature source Oral, resp. rate 16, height 5' 5" (1.651 m), weight 86.728 kg (191 lb 3.2 oz), SpO2 100 %.   General appearance: alert, cooperative, appears stated age, no distress and mildly obese Lungs: clear to auscultation bilaterally Heart: regular rate and rhythm, S1, S2 normal, no murmur, click, rub or gallop Abdomen: soft, non-tender; bowel sounds normal; no masses,  no organomegaly and obese Extremities: extremities normal, atraumatic, no cyanosis or edema Pulses: 2+ and   symmetric Neurologic: Grossly normal  Labs:   Lab Results  Component Value Date   WBC 7.3 10/22/2015   HGB 13.6 10/22/2015   HCT 41.3 10/22/2015   MCV 96.5 10/22/2015   PLT 300 10/22/2015    Recent Labs Lab 10/22/15 1639  NA 141  K 4.0  CL 107  CO2 30  BUN 11  CREATININE 0.85  CALCIUM 9.6  GLUCOSE 89    Lipid Panel     Component Value  Date/Time   CHOL 143 10/23/2015 0755   TRIG 59 10/23/2015 0755   HDL 53 10/23/2015 0755   CHOLHDL 2.7 10/23/2015 0755   VLDL 12 10/23/2015 0755   LDLCALC 78 10/23/2015 0755    BNP (last 3 results)  Recent Labs  10/22/15 1639  BNP 41.2    HEMOGLOBIN A1C Lab Results  Component Value Date   HGBA1C  07/16/2008    5.5 (NOTE)   The ADA recommends the following therapeutic goal for glycemic   control related to Hgb A1C measurement:   Goal of Therapy:   < 7.0% Hgb A1C   Reference: American Diabetes Association: Clinical Practice   Recommendations 2008, Diabetes Care,  2008, 31:(Suppl 1).   MPG 111 07/16/2008    Cardiac Panel (last 3 results)  Recent Labs  10/23/15 0635  TROPONINI <0.03    Lab Results  Component Value Date   CKTOTAL 31 07/15/2008   CKMB 0.8 07/15/2008   TROPONINI <0.03 10/23/2015     TSH  Recent Labs  10/23/15 0755  TSH 3.913    EKG: normal EKG, normal sinus rhythm, unchanged from previous tracings.   Radiology: Dg Chest 2 View  10/22/2015  CLINICAL DATA:  Chest heaviness and shortness of breath for the past 3 days. EXAM: CHEST  2 VIEW COMPARISON:  02/08/2012. FINDINGS: Poor inspiration. The cardiac silhouette remains borderline enlarged. Clear lungs with normal vascularity. Mild thoracic spine degenerative changes. IMPRESSION: No acute abnormality. Electronically Signed   By: Beckie SaltsSteven  Reid M.D.   On: 10/22/2015 16:32    Scheduled Meds: . enoxaparin (LOVENOX) injection  40 mg Subcutaneous Daily   Continuous Infusions:  PRN Meds:.acetaminophen, gi cocktail, ondansetron (ZOFRAN) IV  ASSESSMENT AND PLAN:  1. Chest pain suggestive of unstable angina pectoris. Clearly she has class IV symptoms now with even walking within the house having chest tightness associated with shortness of breath and radiation to the neck and is relieved with rest. There is no evidence of myocardial injury. EKG essentially unremarkable and normal. 2. Hypertension, if  coronary arteries are normal, consider hypertension with hypertensive heart disease as the etiology for dyspnea and chest pain. 3. Obesity  Recommendation: Patient's daughter present at the bedside. I discussed extensively regarding proceeding with coronary angiography versus stress testing. I discussed the risks and benefits, false positives and negatives with the patient. She prefers to proceed with coronary angiography. I'll set her up for this afternoon. Discussed risks, benefits and alternatives of angiogram including but not limited to <1% risk of death, stroke, MI, need for urgent surgical revascularization, renal failure, but not limited to thest. patient is willing to proceed.   Yates DecampGANJI, Ameshia Pewitt, MD 10/23/2015, 12:13 PM [facility] Pager: [phone] Office: [phone] If no answer Cell [phone]

## 2015-10-23 NOTE — H&P (View-Only) (Signed)
CARDIOLOGY CONSULT NOTE  Patient ID: Stephanie Smith MRN: [id number] DOB/AGE: [birth year] 70 y.o.  Admit date: 10/22/2015 Referring Physician  Jacquiline Doe, MD Primary Physician:  Palma Holter, MD Reason for Consultation  Unstable angian  HPI: Stephanie Smith  is a 70 y.o. female  With Trey Paula hypertension, presently not on any medical therapy, no history of diabetes, tobacco use disorder or hyperlipidemia, who had been doing well until past Sunday when she started experiencing exertional chest tightness in the middle of the chest with radiation to her neck. Symptoms would easily get resolved with resting.  She was not much concerned about the symptoms as they were easily getting relieved, however yesterday all day she started having chest tightness with radiation to the neck associated with marked dyspnea even doing minimal activities around the house. Again each time she rested the symptoms would subside spontaneously. She denies any long travel, denies leg edema, no recent change in weight. Her daughter is present the bedside. Denies any neurologic deficits, denies dizziness or syncope, denies hemoptysis. No symptoms to suggest TIA or claudication.  History reviewed. No pertinent past medical history.   Past Surgical History  Procedure Laterality Date  . Abdominal hysterectomy       No family history on file.   Social History: Social History   Social History  . Marital Status: Divorced    Spouse Name: N/A  . Number of Children: N/A  . Years of Education: N/A   Occupational History  . Not on file.   Social History Main Topics  . Smoking status: Never Smoker   . Smokeless tobacco: Not on file  . Alcohol Use: No  . Drug Use: No  . Sexual Activity: Not on file   Other Topics Concern  . Not on file   Social History Narrative     Prescriptions prior to admission  Medication Sig Dispense Refill Last Dose  . cholecalciferol (VITAMIN D) 1000 units tablet Take 1,000 Units by  mouth daily.   Past Week at Unknown time  . ciprofloxacin (CIPRO) 250 MG tablet Take 250 mg by mouth every 12 (twelve) hours.   10/22/2015 at Unknown time  . vitamin E 400 UNIT capsule Take 400 Units by mouth daily.   Past Week at Unknown time  . ibuprofen (ADVIL,MOTRIN) 600 MG tablet Take 1 tablet (600 mg total) by mouth every 8 (eight) hours as needed. (Patient not taking: Reported on 10/22/2015) 30 tablet 1 Not Taking at Unknown time   ROS: General: no fevers/chills/night sweats Eyes: no blurry vision, diplopia, or amaurosis ENT: no sore throat or hearing loss Resp: Dyspnea present. No cough, wheezing, or hemoptysis CV: Chest pain present, no edema or palpitations GI: no abdominal pain, nausea, vomiting, diarrhea, or constipation GU: no dysuria, frequency, or hematuria Skin: no rash Neuro: no headache, numbness, tingling, or weakness of extremities Musculoskeletal: no joint pain or swelling Heme: no bleeding, DVT, or easy bruising Endo: no polydipsia or polyuria    Physical Exam: Blood pressure 163/70, pulse 62, temperature 98.5 F (36.9 C), temperature source Oral, resp. rate 16, height  (1.651 m), weight 86.728 kg (191 lb 3.2 oz), SpO2 100 %.   General appearance: alert, cooperative, appears stated age, no distress and mildly obese Lungs: clear to auscultation bilaterally Heart: regular rate and rhythm, S1, S2 normal, no murmur, click, rub or gallop Abdomen: soft, non-tender; bowel sounds normal; no masses,  no organomegaly and obese Extremities: extremities normal, atraumatic, no cyanosis or edema Pulses: 2+ and  symmetric Neurologic: Grossly normal  Labs:   Lab Results  Component Value Date   WBC 7.3 10/22/2015   HGB 13.6 10/22/2015   HCT 41.3 10/22/2015   MCV 96.5 10/22/2015   PLT 300 10/22/2015    Recent Labs Lab 10/22/15 1639  NA 141  K 4.0  CL 107  CO2 30  BUN 11  CREATININE 0.85  CALCIUM 9.6  GLUCOSE 89    Lipid Panel     Component Value  Date/Time   CHOL 143 10/23/2015 0755   TRIG 59 10/23/2015 0755   HDL 53 10/23/2015 0755   CHOLHDL 2.7 10/23/2015 0755   VLDL 12 10/23/2015 0755   LDLCALC 78 10/23/2015 0755    BNP (last 3 results)  Recent Labs  10/22/15 1639  BNP 41.2    HEMOGLOBIN A1C Lab Results  Component Value Date   HGBA1C  07/16/2008    5.5 (NOTE)   The ADA recommends the following therapeutic goal for glycemic   control related to Hgb A1C measurement:   Goal of Therapy:   < 7.0% Hgb A1C   Reference: American Diabetes Association: Clinical Practice   Recommendations 2008, Diabetes Care,  2008, 31:(Suppl 1).   MPG 111 07/16/2008    Cardiac Panel (last 3 results)  Recent Labs  10/23/15 0635  TROPONINI <0.03    Lab Results  Component Value Date   CKTOTAL 31 07/15/2008   CKMB 0.8 07/15/2008   TROPONINI <0.03 10/23/2015     TSH  Recent Labs  10/23/15 0755  TSH 3.913    EKG: normal EKG, normal sinus rhythm, unchanged from previous tracings.   Radiology: Dg Chest 2 View  10/22/2015  CLINICAL DATA:  Chest heaviness and shortness of breath for the past 3 days. EXAM: CHEST  2 VIEW COMPARISON:  02/08/2012. FINDINGS: Poor inspiration. The cardiac silhouette remains borderline enlarged. Clear lungs with normal vascularity. Mild thoracic spine degenerative changes. IMPRESSION: No acute abnormality. Electronically Signed   By: Beckie SaltsSteven  Reid M.D.   On: 10/22/2015 16:32    Scheduled Meds: . enoxaparin (LOVENOX) injection  40 mg Subcutaneous Daily   Continuous Infusions:  PRN Meds:.acetaminophen, gi cocktail, ondansetron (ZOFRAN) IV  ASSESSMENT AND PLAN:  1. Chest pain suggestive of unstable angina pectoris. Clearly she has class IV symptoms now with even walking within the house having chest tightness associated with shortness of breath and radiation to the neck and is relieved with rest. There is no evidence of myocardial injury. EKG essentially unremarkable and normal. 2. Hypertension, if  coronary arteries are normal, consider hypertension with hypertensive heart disease as the etiology for dyspnea and chest pain. 3. Obesity  Recommendation: Patient's daughter present at the bedside. I discussed extensively regarding proceeding with coronary angiography versus stress testing. I discussed the risks and benefits, false positives and negatives with the patient. She prefers to proceed with coronary angiography. I'll set her up for this afternoon. Discussed risks, benefits and alternatives of angiogram including but not limited to <1% risk of death, stroke, MI, need for urgent surgical revascularization, renal failure, but not limited to thest. patient is willing to proceed.   Yates DecampGANJI, Jia Mohamed, MD 10/23/2015, 12:13 PM [facility] Pager: [phone] Office: [phone] If no answer Cell [phone]

## 2015-10-23 NOTE — Care Management Obs Status (Signed)
MEDICARE OBSERVATION STATUS NOTIFICATION   Patient Details  Name: Stephanie SayreJanice Smith MRN: [id number] Date of Birth: 01/18/1946   Medicare Observation Status Notification Given:  Yes    Gala LewandowskyGraves-Bigelow, Joevon Holliman Kaye, RN 10/23/2015, 11:58 AM

## 2015-10-23 NOTE — Progress Notes (Signed)
[facility] call [facility]. Patient in for observation. Received a call back. Awaiting orders for admission. Will continue to monitor patient.

## 2015-10-24 ENCOUNTER — Encounter (HOSPITAL_COMMUNITY): Payer: Self-pay | Admitting: Cardiology

## 2015-10-24 MED ORDER — LISINOPRIL 10 MG PO TABS: 10.0000 mg | ORAL_TABLET | Freq: Every day | ORAL | Status: AC

## 2015-10-24 NOTE — Discharge Instructions (Signed)
You were admitted for chest pain and it was found to be due to your hypertension. We recommend a blood pressure medication and to follow up for your PCP to discuss medications and lifestyle modifications.  Hypertension Hypertension, commonly called high blood pressure, is when the force of blood pumping through your arteries is too strong. Your arteries are the blood vessels that carry blood from your heart throughout your body. A blood pressure reading consists of a higher number over a lower number, such as 110/72. The higher number (systolic) is the pressure inside your arteries when your heart pumps. The lower number (diastolic) is the pressure inside your arteries when your heart relaxes. Ideally you want your blood pressure below 120/80. Hypertension forces your heart to work harder to pump blood. Your arteries may become narrow or stiff. Having untreated or uncontrolled hypertension can cause heart attack, stroke, kidney disease, and other problems. RISK FACTORS Some risk factors for high blood pressure are controllable. Others are not.  Risk factors you cannot control include:   Race. You may be at higher risk if you are African American.  Age. Risk increases with age.  Gender. Men are at higher risk than women before age 69 years. After age 69, women are at higher risk than men. Risk factors you can control include:  Not getting enough exercise or physical activity.  Being overweight.  Getting too much fat, sugar, calories, or salt in your diet.  Drinking too much alcohol. SIGNS AND SYMPTOMS Hypertension does not usually cause signs or symptoms. Extremely high blood pressure (hypertensive crisis) may cause headache, anxiety, shortness of breath, and nosebleed. DIAGNOSIS To check if you have hypertension, your health care provider will measure your blood pressure while you are seated, with your arm held at the level of your heart. It should be measured at least twice using the same  arm. Certain conditions can cause a difference in blood pressure between your right and left arms. A blood pressure reading that is higher than normal on one occasion does not mean that you need treatment. If it is not clear whether you have high blood pressure, you may be asked to return on a different day to have your blood pressure checked again. Or, you may be asked to monitor your blood pressure at home for 1 or more weeks. TREATMENT Treating high blood pressure includes making lifestyle changes and possibly taking medicine. Living a healthy lifestyle can help lower high blood pressure. You may need to change some of your habits. Lifestyle changes may include:  Following the DASH diet. This diet is high in fruits, vegetables, and whole grains. It is low in salt, red meat, and added sugars.  Keep your sodium intake below 2,300 mg per day.  Getting at least 30-45 minutes of aerobic exercise at least 4 times per week.  Losing weight if necessary.  Not smoking.  Limiting alcoholic beverages.  Learning ways to reduce stress. Your health care provider may prescribe medicine if lifestyle changes are not enough to get your blood pressure under control, and if one of the following is true:  You are 1818-69 years of age and your systolic blood pressure is above 140.  You are 69 years of age or older, and your systolic blood pressure is above 150.  Your diastolic blood pressure is above 90.  You have diabetes, and your systolic blood pressure is over 140 or your diastolic blood pressure is over 90.  You have kidney disease and your blood  pressure is above 140/90.  You have heart disease and your blood pressure is above 140/90. Your personal target blood pressure may vary depending on your medical conditions, your age, and other factors. HOME CARE INSTRUCTIONS  Have your blood pressure rechecked as directed by your health care provider.   Take medicines only as directed by your health  care provider. Follow the directions carefully. Blood pressure medicines must be taken as prescribed. The medicine does not work as well when you skip doses. Skipping doses also puts you at risk for problems.  Do not smoke.   Monitor your blood pressure at home as directed by your health care provider. SEEK MEDICAL CARE IF:   You think you are having a reaction to medicines taken.  You have recurrent headaches or feel dizzy.  You have swelling in your ankles.  You have trouble with your vision. SEEK IMMEDIATE MEDICAL CARE IF:  You develop a severe headache or confusion.  You have unusual weakness, numbness, or feel faint.  You have severe chest or abdominal pain.  You vomit repeatedly.  You have trouble breathing. MAKE SURE YOU:   Understand these instructions.  Will watch your condition.  Will get help right away if you are not doing well or get worse.   This information is not intended to replace advice given to you by your health care provider. Make sure you discuss any questions you have with your health care provider.   Document Released: 03/30/2005 Document Revised: 08/14/2014 Document Reviewed: 01/20/2013 Elsevier Interactive Patient Education [employer].

## 2015-10-24 NOTE — Discharge Summary (Signed)
[facility]  Patient name: Stephanie Smith Medical record number: [id number] Date of birth: 09/20/1945 Age: 70 y.o. Gender: female Date of Admission: 10/22/2015  Date of Discharge: 10/24/15 Admitting Physician: Moses MannersWilliam A Hensel, MD  Primary Care Provider: Palma HolterKanishka G Gunadasa, MD Consultants: Cardiology  Indication for Hospitalization: Chest pain  Discharge Diagnoses/Problem List:  Chest Pain HTN  Disposition: Home  Discharge Condition: Stable, improved  Discharge Exam:  General: sitting up in bed, in NAD Cardiovascular: RRR, no murmurs appreciated Respiratory: CTAB, normal effort Abdomen: soft, nt, nd +bs Extremities: trace edema to knees (chronic finding per patient), no cyanosis Neuro: A&Ox3, no focal deficits  Brief Hospital Course:  Stephanie SayreJanice Cotterill is a 70 y.o. female presenting with Chest pain. PMH is significant for HTN not on any medications.  Patient presented with substernal non-radiating chest pain and pressure starting 3-4 days prior to presentation that was exacerbated with exertion. She was admitted for ACS rule out and was found to have normal coronary arteries on coronary angiography. Her risk stratification labs were all within normal limits. Throughout her hospital stay, her blood pressures in the daytime were 150s/70s and she complained of a daily headache relieved partially by tylenol. Per cardiology, the likely etiology of her chest pain was HTN with hypertensive heart disease. Patient was counseled about starting an antihypertensive medication, she voiced good understanding about the risks and benefits but she stated that she will most likely decide to pursue lifestyle modifications. She still asked to be prescribed lisinopril despite her reservations and was particularly interested in speaking with dietician Dr. Gerilyn PilgrimSykes as an outpatient.  Issues for Follow Up:  1. Patient was discharged with a prescription for lisinopril  although she voiced that she will most likely not take the medication. 2. Patient would like to discuss dietary modifications with dietician Dr. Gerilyn PilgrimSykes as outpatient.   Significant Procedures: coronary angiography  Significant Labs and Imaging:   Recent Labs Lab 10/22/15 1639  WBC 7.3  HGB 13.6  HCT 41.3  PLT 300    Recent Labs Lab 10/22/15 1639  NA 141  K 4.0  CL 107  CO2 30  GLUCOSE 89  BUN 11  CREATININE 0.85  CALCIUM 9.6    Lipid Panel     Component Value Date/Time   CHOL 143 10/23/2015 0755   TRIG 59 10/23/2015 0755   HDL 53 10/23/2015 0755   CHOLHDL 2.7 10/23/2015 0755   VLDL 12 10/23/2015 0755   LDLCALC 78 10/23/2015 0755   TSH 3.913  A1c: 5.5  Results/Tests Pending at Time of Discharge: none  Discharge Medications:    Medication List    STOP taking these medications        ciprofloxacin 250 MG tablet  Commonly known as:  CIPRO      TAKE these medications        cholecalciferol 1000 units tablet  Commonly known as:  VITAMIN D  Take 1,000 Units by mouth daily.     ibuprofen 600 MG tablet  Commonly known as:  ADVIL,MOTRIN  Take 1 tablet (600 mg total) by mouth every 8 (eight) hours as needed.     lisinopril 10 MG tablet  Commonly known as:  ZESTRIL  Take 1 tablet (10 mg total) by mouth daily.     vitamin E 400 UNIT capsule  Take 400 Units by mouth daily.        Discharge Instructions: Please refer to Patient Instructions section of EMR for full details.  Patient  was counseled important signs and symptoms that should prompt return to medical care, changes in medications, dietary instructions, activity restrictions, and follow up appointments.   Follow-Up Appointments:     Follow-up Information    Follow up with Palma Holter, MD. Schedule an appointment as soon as possible for a visit in 1 week.   Specialty:  Family Medicine   Why:  hospital follow up   Contact information:   [address] [place]  [address] [phone]       Leland Her, DO 10/24/2015, 1:26 PM  PGY-1 [facility]

## 2015-10-24 NOTE — Progress Notes (Signed)
Family Medicine Teaching Service Daily Progress Note Intern Pager: [phone]  Patient name: Stephanie SayreJanice Smith Medical record number: [id number] Date of birth: 07/06/1945 Age: 70 y.o. Gender: female  Primary Care Provider: Palma HolterKanishka G Gunadasa, MD Consultants: Cardiology Code Status: FULL  Pt Overview and Major Events to Date:  7/12 admitted for chest pain  Assessment and Plan: Stephanie SayreJanice Keisling is a 70 y.o. female presenting with Chest pain. PMH is significant for HTN.  Chest Pain. Likely unstable angina. Heart score 4. Admitted for ACS rule out. Initial EKG and trop negative. Concern for cardiac etiology given exertional nature. CXR without signs of pneumonia. Wells score 0. Does not seem to have MSK or GI component. Troponins <0.03x2, 0.03 Per cardiology, coronary angiography yesterday (7/12) showed normal coronary arteries. Risk labs all WNL: Chol 142 TG 59 HDL 53 LDL 78, A1c 5.5, TSH 3.913 - per cardiology, since cath normal consider HTN with hypertensive heart disease as etiology for dyspnea and chest pain, awaiting recs. - continue ASA 81mg  while inpatient  HTN. Not on any medications on admit. 120s-150s/70s-80s on admission. BP 152/76 this am. - Continue to monitor - Start lisinopril on DC, patient states she will probably not take it but would like it prescribed anyways. She would like to try diet modification with Dr Gerilyn PilgrimSykes in clinic as outpatient  FEN/GI: Heart Healthy diet, SLIV Prophylaxis: Lovenox  Disposition: Admit for observation pending above management.  Subjective:  Patients that she feels well today but has had HA overnight, dull, frontal and relieved with tylenol but it returned this am. States her current HA is very mild, has had these at home, usually self resolves throughout the day. Thinks could be because of her BP. States is very resistant to start taking any medications because she does not like "being dependent on drugs" but will consider if we recommend. Denies CP, SOB,  palpitations, vision changes, dizziness.  Objective: Temp:  [97.9 F (36.6 C)-98.3 F (36.8 C)] 98.1 F (36.7 C) (07/13 0516) Pulse Rate:  [0-81] 60 (07/13 0516) Resp:  [0-99] 18 (07/13 0516) BP: (128-183)/(62-95) 152/76 mmHg (07/13 0516) SpO2:  [0 %-100 %] 100 % (07/13 0516) Weight:  [198 lb 6.4 oz (89.994 kg)] 198 lb 6.4 oz (89.994 kg) (07/13 [id number])   Physical Exam: General: sitting up in bed, in NAD Cardiovascular: RRR, no murmurs appreciated Respiratory: CTAB, normal effort Abdomen: soft, nt, nd +bs Extremities: trace edema to knees (chronic finding per patient), no cyanosis Neuro: A&Ox3, no focal deficits   Laboratory:  Recent Labs Lab 10/22/15 1639  WBC 7.3  HGB 13.6  HCT 41.3  PLT 300    Recent Labs Lab 10/22/15 1639  NA 141  K 4.0  CL 107  CO2 30  BUN 11  CREATININE 0.85  CALCIUM 9.6  GLUCOSE 89    Troponin <0.03, <0.03, 0.03  Imaging/Diagnostic Tests:  Left Heart Cath and Coronary Angiography   Conclusion     The left ventricular systolic function is normal.  Normal coronary arteries.  Suspect hypertension and hypertensive heart disease for angina symptoms.       Leland HerElsia J Yoo, DO 10/24/2015, 7:08 AM PGY-1, [facility] FPTS Intern pager: [phone], text pages welcome

## 2017-03-09 ENCOUNTER — Other Ambulatory Visit: Payer: Self-pay | Admitting: Nurse Practitioner

## 2017-03-09 DIAGNOSIS — E2839 Other primary ovarian failure: Secondary | ICD-10-CM

## 2017-03-09 DIAGNOSIS — Z1231 Encounter for screening mammogram for malignant neoplasm of breast: Secondary | ICD-10-CM

## 2017-04-07 ENCOUNTER — Ambulatory Visit: Payer: Medicare HMO

## 2017-04-07 ENCOUNTER — Other Ambulatory Visit: Payer: Medicare HMO

## 2018-06-06 ENCOUNTER — Emergency Department (HOSPITAL_COMMUNITY): Payer: Medicare HMO

## 2018-06-06 ENCOUNTER — Other Ambulatory Visit: Payer: Self-pay

## 2018-06-06 ENCOUNTER — Encounter (HOSPITAL_COMMUNITY): Payer: Self-pay | Admitting: Emergency Medicine

## 2018-06-06 ENCOUNTER — Emergency Department (HOSPITAL_COMMUNITY)
Admission: EM | Admit: 2018-06-06 | Discharge: 2018-06-06 | Disposition: A | Discharge status: Home / Self Care | Payer: Medicare HMO | Attending: Emergency Medicine | Admitting: Emergency Medicine

## 2018-06-06 DIAGNOSIS — R42 Dizziness and giddiness: Secondary | ICD-10-CM | POA: Insufficient documentation

## 2018-06-06 DIAGNOSIS — Z79899 Other long term (current) drug therapy: Secondary | ICD-10-CM | POA: Insufficient documentation

## 2018-06-06 DIAGNOSIS — I10 Essential (primary) hypertension: Secondary | ICD-10-CM | POA: Diagnosis not present

## 2018-06-06 LAB — URINALYSIS, ROUTINE W REFLEX MICROSCOPIC
Bilirubin Urine: NEGATIVE
GLUCOSE, UA: NEGATIVE mg/dL
Hgb urine dipstick: NEGATIVE
KETONES UR: NEGATIVE mg/dL
Nitrite: NEGATIVE
PROTEIN: NEGATIVE mg/dL
Specific Gravity, Urine: 1.005 (ref 1.005–1.030)
pH: 8 (ref 5.0–8.0)

## 2018-06-06 LAB — CBC WITH DIFFERENTIAL/PLATELET
ABS IMMATURE GRANULOCYTES: 0.03 10*3/uL (ref 0.00–0.07)
Basophils Absolute: 0 10*3/uL (ref 0.0–0.1)
Basophils Relative: 0 %
EOS PCT: 2 %
Eosinophils Absolute: 0.1 10*3/uL (ref 0.0–0.5)
HEMATOCRIT: 41 % (ref 36.0–46.0)
Hemoglobin: 13.3 g/dL (ref 12.0–15.0)
Immature Granulocytes: 0 %
LYMPHS ABS: 1.2 10*3/uL (ref 0.7–4.0)
LYMPHS PCT: 16 %
MCH: 31.2 pg (ref 26.0–34.0)
MCHC: 32.4 g/dL (ref 30.0–36.0)
MCV: 96.2 fL (ref 80.0–100.0)
MONOS PCT: 8 %
Monocytes Absolute: 0.6 10*3/uL (ref 0.1–1.0)
NEUTROS ABS: 5.9 10*3/uL (ref 1.7–7.7)
Neutrophils Relative %: 74 %
Platelets: 250 10*3/uL (ref 150–400)
RBC: 4.26 MIL/uL (ref 3.87–5.11)
RDW: 12.7 % (ref 11.5–15.5)
WBC: 7.9 10*3/uL (ref 4.0–10.5)
nRBC: 0 % (ref 0.0–0.2)

## 2018-06-06 LAB — COMPREHENSIVE METABOLIC PANEL
ALBUMIN: 3.5 g/dL (ref 3.5–5.0)
ALT: 13 U/L (ref 0–44)
AST: 17 U/L (ref 15–41)
Alkaline Phosphatase: 57 U/L (ref 38–126)
Anion gap: 8 (ref 5–15)
BUN: 8 mg/dL (ref 8–23)
CHLORIDE: 107 mmol/L (ref 98–111)
CO2: 25 mmol/L (ref 22–32)
CREATININE: 0.72 mg/dL (ref 0.44–1.00)
Calcium: 9.5 mg/dL (ref 8.9–10.3)
GFR calc Af Amer: >60 mL/min (ref >60)
GFR calc non Af Amer: >60 mL/min (ref >60)
GLUCOSE: 103 mg/dL — AB (ref 70–99)
POTASSIUM: 3.4 mmol/L — AB (ref 3.5–5.1)
Sodium: 140 mmol/L (ref 135–145)
Total Bilirubin: 0.7 mg/dL (ref 0.3–1.2)
Total Protein: 7.6 g/dL (ref 6.5–8.1)

## 2018-06-06 LAB — TROPONIN I

## 2018-06-06 MED ORDER — ACETAMINOPHEN 325 MG PO TABS
650.0000 mg | ORAL_TABLET | Freq: Once | ORAL | Status: AC
  Administered 2018-06-06: 650 mg via ORAL
  Filled 2018-06-06: qty 2

## 2018-06-06 MED ORDER — MECLIZINE HCL 25 MG PO TABS: 25.0000 mg | ORAL_TABLET | Freq: Three times a day (TID) | ORAL | 0 refills | Status: AC | PRN

## 2018-06-06 MED ORDER — MECLIZINE HCL 25 MG PO TABS
25.0000 mg | ORAL_TABLET | Freq: Once | ORAL | Status: AC
Start: 2018-06-06 — End: 2018-06-06
  Administered 2018-06-06: 25 mg via ORAL
  Filled 2018-06-06: qty 1

## 2018-06-06 NOTE — ED Notes (Signed)
Patient transported to MRI 

## 2018-06-06 NOTE — ED Provider Notes (Addendum)
[facility] EMERGENCY DEPARTMENT Provider Note   CSN: [id number] Arrival date & time: 06/06/18  7494    History   Chief Complaint Chief Complaint  Patient presents with  . Dizziness  . Shortness of Breath    HPI Stephanie Smith is a 73 y.o. female.     Patient is a 73 year old female with history of hypertension who presents with dizziness and shortness of breath.  She states that she rolled over in bed this morning and started feeling dizzy.  This happened about 640 this morning.  She reports dizziness and shortness of breath.  She feels like she is going to pass out but says that the dizziness feels more like the room is spinning.  She had a slight headache in the frontal area this morning but denies any currently.  She has associated shortness of breath.  No nausea or vomiting.  She felt like her chest was little tight this morning but denies any now.  No recent illnesses.  No fevers cough or cold symptoms.  She denies any vomiting or diarrhea.  She denies any numbness or weakness to her extremities.  No vision changes.  No double vision.  No speech deficits.  She does state that yesterday the right side of her face was numb but denies any now.     History reviewed. No pertinent past medical history.  Patient Active Problem List   Diagnosis Date Noted  . Pain in the chest   . Dyspnea   . Chest pain 10/22/2015  . Hypertension 03/23/2012    Past Surgical History:  Procedure Laterality Date  . ABDOMINAL HYSTERECTOMY    . CARDIAC CATHETERIZATION N/A 10/23/2015   Procedure: Left Heart Cath and Coronary Angiography;  Surgeon: Yates Decamp, MD;  Location: [facility] INVASIVE CV LAB;  Service: Cardiovascular;  Laterality: N/A;     OB History   No obstetric history on file.      Home Medications    Prior to Admission medications   Medication Sig Start Date End Date Taking? Authorizing Provider  Cyanocobalamin (VITAMIN B-12 PO) Take 1 tablet by mouth daily.   Yes  [provider]  Pyridoxine HCl (VITAMIN B-6 PO) Take 1 tablet by mouth daily.   Yes [provider]  VITAMIN D PO Take 1,000 Units by mouth daily.    Yes [provider]  ibuprofen (ADVIL,MOTRIN) 600 MG tablet Take 1 tablet (600 mg total) by mouth every 8 (eight) hours as needed. Patient not taking: Reported on 10/22/2015 01/25/15   Raliegh Ip, DO  lisinopril (ZESTRIL) 10 MG tablet Take 1 tablet (10 mg total) by mouth daily. Patient not taking: Reported on 06/06/2018 10/24/15   Leland Her, DO  meclizine (ANTIVERT) 25 MG tablet Take 1 tablet (25 mg total) by mouth 3 (three) times daily as needed for dizziness. 06/06/18   Rolan Bucco, MD    Family History No family history on file.  Social History Social History   Tobacco Use  . Smoking status: Never Smoker  . Smokeless tobacco: Never Used  Substance Use Topics  . Alcohol use: No  . Drug use: No     Allergies   Morphine and Aspirin   Review of Systems Review of Systems  Constitutional: Negative for chills, diaphoresis, fatigue and fever.  HENT: Negative for congestion, rhinorrhea and sneezing.   Eyes: Negative.   Respiratory: Positive for shortness of breath. Negative for cough and chest tightness.   Cardiovascular: Positive for chest pain.  Negative for leg swelling.  Gastrointestinal: Negative for abdominal pain, blood in stool, diarrhea, nausea and vomiting.  Genitourinary: Negative for difficulty urinating, flank pain, frequency and hematuria.  Musculoskeletal: Negative for arthralgias and back pain.  Skin: Negative for rash.  Neurological: Positive for dizziness and numbness. Negative for speech difficulty, weakness and headaches.     Physical Exam Updated Vital Signs BP (!) 145/68   Pulse 67   Temp 98.3 F (36.8 C) (Oral)   Resp 15   Wt 90 kg   SpO2 98%   BMI 33.02 kg/m   Physical Exam Constitutional:      Appearance: She is well-developed.  HENT:     Head:  Normocephalic and atraumatic.  Eyes:     Pupils: Pupils are equal, round, and reactive to light.     Comments: No nystagmus  Neck:     Musculoskeletal: Normal range of motion and neck supple.  Cardiovascular:     Rate and Rhythm: Normal rate and regular rhythm.     Heart sounds: Normal heart sounds.  Pulmonary:     Effort: Pulmonary effort is normal. No respiratory distress.     Breath sounds: Normal breath sounds. No wheezing or rales.  Chest:     Chest wall: No tenderness.  Abdominal:     General: Bowel sounds are normal.     Palpations: Abdomen is soft.     Tenderness: There is no abdominal tenderness. There is no guarding or rebound.  Musculoskeletal: Normal range of motion.  Lymphadenopathy:     Cervical: No cervical adenopathy.  Skin:    General: Skin is warm and dry.     Findings: No rash.  Neurological:     Mental Status: She is alert and oriented to person, place, and time.     Comments: Motor 5/5 all extremities Sensation grossly intact to LT all extremities Finger to Nose intact, no pronator drift CN II-XII grossly intact Visual fields full to confrontation       ED Treatments / Results  Labs (all labs ordered are listed, but only abnormal results are displayed) Labs Reviewed  COMPREHENSIVE METABOLIC PANEL - Abnormal; Notable for the following components:      Result Value   Potassium 3.4 (*)    Glucose, Bld 103 (*)    All other components within normal limits  URINALYSIS, ROUTINE W REFLEX MICROSCOPIC - Abnormal; Notable for the following components:   Color, Urine STRAW (*)    Leukocytes,Ua SMALL (*)    Bacteria, UA RARE (*)    All other components within normal limits  CBC WITH DIFFERENTIAL/PLATELET  TROPONIN I  TROPONIN I    EKG EKG Interpretation  Date/Time:  Monday June 06 2018 09:07:02 EST Ventricular Rate:  58 PR Interval:    QRS Duration: 91 QT Interval:  410 QTC Calculation: 403 R Axis:   -8 Text Interpretation:  Sinus rhythm  since last tracing no significant change Confirmed by Rolan Bucco [phone]) on 06/06/2018 9:13:21 AM   Radiology Ct Head Wo Contrast  Result Date: 06/06/2018 CLINICAL DATA:  Vertigo with nausea and vomiting EXAM: CT HEAD WITHOUT CONTRAST TECHNIQUE: Contiguous axial images were obtained from the base of the skull through the vertex without intravenous contrast. COMPARISON:  Head CT January 31, 2012 and Smith MRI February 08, 2012 FINDINGS: Smith: The ventricles and sulci are within normal limits for age. There is no intracranial mass, hemorrhage, extra-axial fluid collection, or midline shift. There is minimal small vessel disease in the centra  semiovale bilaterally. Elsewhere, the Smith parenchyma appears unremarkable. No acute infarct is evident. Vascular: No hyperdense vessel. There is slight calcification in each carotid siphon region. Skull: The bony calvarium appears intact. Sinuses/Orbits: Visualized paranasal sinuses are clear. Orbits appear symmetric bilaterally. Other: Mastoid air cells are clear. There is debris in the right external auditory canal. IMPRESSION: Minimal periventricular small vessel disease. No acute infarct. No mass or hemorrhage. There is slight arterial vascular calcification. There is probable cerumen in the right external auditory canal. Electronically Signed   By: Bretta Bang III M.D.   On: 06/06/2018 09:53   Stephanie Smith Wo Contrast  Result Date: 06/06/2018 CLINICAL DATA:  Ataxia and stroke. Symptoms began after rolling over in bed at 6:40 a.m. EXAM: MRI HEAD WITHOUT CONTRAST TECHNIQUE: Multiplanar, multiecho pulse sequences of the Smith and surrounding structures were obtained without intravenous contrast. COMPARISON:  CT head without contrast 06/06/2018 FINDINGS: Smith: No acute infarct, hemorrhage, or mass lesion is present. Mild periventricular and subcortical T2 changes are within normal limits for age. The ventricles are of normal size. No significant extraaxial fluid  collection is present. White matter changes extend into the brainstem. Cerebellum is normal. The internal auditory canals are within normal limits. Vascular: Flow is present in the major intracranial arteries. Skull and upper cervical spine: The craniocervical junction is normal. Upper cervical spine is within normal limits. Marrow signal is unremarkable. Sinuses/Orbits: The paranasal sinuses and mastoid air cells are clear. The globes and orbits are within normal limits. IMPRESSION: 1. Mild atrophy and white matter disease is likely within normal limits for age. 2. Otherwise normal MRI of the Smith. No acute or focal lesion to explain new onset dizziness. Electronically Signed   By: Marin Roberts M.D.   On: 06/06/2018 13:17    Procedures Procedures (including critical care time)  Medications Ordered in ED Medications  meclizine (ANTIVERT) tablet 25 mg (25 mg Oral Given 06/06/18 0931)  acetaminophen (TYLENOL) tablet 650 mg (650 mg Oral Given 06/06/18 1455)     Initial Impression / Assessment and Plan / ED Course  I have reviewed the triage vital signs and the nursing notes.  Pertinent labs & imaging results that were available during my care of the patient were reviewed by me and considered in my medical decision making (see chart for details).        Patient is a 73 year old female who presents with dizziness.  It started after she rolled over in bed.  She had a reported numbness to the right side of her face yesterday.  No other neurologic deficits are noted.  She currently is neurologically intact.  She had an MRI which shows no acute abnormality.  Her labs are otherwise non-concerning.  She is feeling much better after meclizine is able to ambulate without ataxia or significant dizziness.  She was discharged home in good condition.  She was encouraged to have close follow-up with her PCP.  Return precautions were given.    She had reported some shortness of breath and an episode of  chest pressure earlier.  She has not had any further episodes.  She does not have any ischemic changes noted on EKG.  She has had 2- troponins.  I feel she can be discharged with outpatient follow-up.  Final Clinical Impressions(s) / ED Diagnoses   Final diagnoses:  Vertigo    ED Discharge Orders         Ordered    meclizine (ANTIVERT) 25 MG tablet  3 times daily  PRN     06/06/18 1514           Rolan Bucco, MD 06/06/18 1516    Rolan Bucco, MD 06/06/18 [phone]

## 2018-06-06 NOTE — ED Notes (Signed)
Pt back from scan 

## 2018-06-06 NOTE — ED Notes (Signed)
Pt ambulated to bathroom without assistance 

## 2018-06-06 NOTE — ED Notes (Signed)
RN informed ok to send visitor back 

## 2018-06-06 NOTE — ED Notes (Signed)
Patient verbalizes understanding of discharge instructions. Opportunity for questioning and answers were provided. Armband removed by staff, pt discharged from ED.  

## 2018-06-06 NOTE — ED Triage Notes (Signed)
Pt in from home with dizziness that began at 0640 when she rolled over in bed. Feels the room is spinning, initially felt some chest tightness but now resolved. States she feels sob since the first occurrence of dizziness today

## 2019-01-12 ENCOUNTER — Other Ambulatory Visit: Payer: Self-pay | Admitting: Emergency Medicine

## 2019-01-12 DIAGNOSIS — Z20822 Contact with and (suspected) exposure to covid-19: Secondary | ICD-10-CM

## 2019-01-13 ENCOUNTER — Telehealth: Payer: Self-pay | Admitting: Family Medicine

## 2019-01-13 LAB — NOVEL CORONAVIRUS, NAA: SARS-CoV-2, NAA: NOT DETECTED

## 2019-01-13 NOTE — Telephone Encounter (Signed)
Pt aware covid lab test negative, not detected °

## 2020-10-31 ENCOUNTER — Other Ambulatory Visit: Payer: Self-pay

## 2020-10-31 ENCOUNTER — Encounter (HOSPITAL_COMMUNITY): Payer: Self-pay

## 2020-10-31 ENCOUNTER — Emergency Department (HOSPITAL_COMMUNITY): Payer: Medicare HMO

## 2020-10-31 ENCOUNTER — Emergency Department (HOSPITAL_COMMUNITY)
Admission: EM | Admit: 2020-10-31 | Discharge: 2020-11-01 | Disposition: A | Discharge status: Home / Self Care | Payer: Medicare HMO | Attending: Emergency Medicine | Admitting: Emergency Medicine

## 2020-10-31 DIAGNOSIS — R519 Headache, unspecified: Secondary | ICD-10-CM | POA: Diagnosis present

## 2020-10-31 DIAGNOSIS — I10 Essential (primary) hypertension: Secondary | ICD-10-CM | POA: Insufficient documentation

## 2020-10-31 DIAGNOSIS — R011 Cardiac murmur, unspecified: Secondary | ICD-10-CM | POA: Insufficient documentation

## 2020-10-31 LAB — BASIC METABOLIC PANEL
Anion gap: 7 (ref 5–15)
BUN: 8 mg/dL (ref 8–23)
CO2: 29 mmol/L (ref 22–32)
Calcium: 9.5 mg/dL (ref 8.9–10.3)
Chloride: 102 mmol/L (ref 98–111)
Creatinine, Ser: 0.67 mg/dL (ref 0.44–1.00)
GFR, Estimated: >60 mL/min (ref >60)
Glucose, Bld: 114 mg/dL — ABNORMAL HIGH (ref 70–99)
Potassium: 3.9 mmol/L (ref 3.5–5.1)
Sodium: 138 mmol/L (ref 135–145)

## 2020-10-31 MED ORDER — IBUPROFEN 400 MG PO TABS
600.0000 mg | ORAL_TABLET | Freq: Once | ORAL | Status: AC
  Administered 2020-10-31: 600 mg via ORAL
  Filled 2020-10-31: qty 1

## 2020-10-31 MED ORDER — ACETAMINOPHEN 325 MG PO TABS: 650.0000 mg | ORAL_TABLET | Freq: Four times a day (QID) | ORAL | Status: DC | PRN

## 2020-10-31 NOTE — ED Provider Notes (Signed)
[facility] EMERGENCY DEPARTMENT Provider Note   CSN: [id number] Arrival date & time: 10/31/20  9485     History Chief Complaint  Patient presents with   Hypertension    Stephanie Smith is a 75 y.o. female.   Hypertension  75 year old female PMHx HTN (not on any medications), presenting for elevated blood pressure in the 190s last night, spontaneously improved over the course of today.  She reports that she frequently gets elevated blood pressures at night, and this appears to be associated when she prays before bed, which when asked she states she "prays intensely."  She additionally notes constant, moderate in severity, frontal, nonradiating headache that began last night with the elevated pressure.  This continues to persist today.  No other medication changes or other potential inciting events.  No further medical concerns at this time including fevers, chills, sore throat, rhinorrhea, cough, shortness of breath, palpitations, pedal edema, inguinal pain, N/V, bowel/bladder change, focal weakness/paresthesias.  History obtained from patient and chart review.     History reviewed. No pertinent past medical history.  Patient Active Problem List   Diagnosis Date Noted   Pain in the chest    Dyspnea    Chest pain 10/22/2015   Hypertension 03/23/2012    Past Surgical History:  Procedure Laterality Date   ABDOMINAL HYSTERECTOMY     CARDIAC CATHETERIZATION N/A 10/23/2015   Procedure: Left Heart Cath and Coronary Angiography;  Surgeon: Yates Decamp, MD;  Location: [facility] INVASIVE CV LAB;  Service: Cardiovascular;  Laterality: N/A;     OB History   No obstetric history on file.     History reviewed. No pertinent family history.  Social History   Tobacco Use   Smoking status: Never   Smokeless tobacco: Never  Substance Use Topics   Alcohol use: No   Drug use: No    Home Medications Prior to Admission medications   Medication Sig Start Date End Date  Taking? Authorizing Provider  Cyanocobalamin (VITAMIN B-12 PO) Take 1 tablet by mouth daily.   Yes [provider]  Pyridoxine HCl (VITAMIN B-6 PO) Take 1 tablet by mouth daily.   Yes [provider]  VITAMIN D PO Take 1,000 Units by mouth daily.    Yes [provider]  ibuprofen (ADVIL,MOTRIN) 600 MG tablet Take 1 tablet (600 mg total) by mouth every 8 (eight) hours as needed. Patient not taking: No sig reported 01/25/15   Delynn Flavin M, DO  lisinopril (ZESTRIL) 10 MG tablet Take 1 tablet (10 mg total) by mouth daily. Patient not taking: No sig reported 10/24/15   Leland Her, DO  meclizine (ANTIVERT) 25 MG tablet Take 1 tablet (25 mg total) by mouth 3 (three) times daily as needed for dizziness. Patient not taking: Reported on 10/31/2020 06/06/18   Rolan Bucco, MD    Allergies    Morphine and Aspirin  Review of Systems   Review of Systems  All other systems reviewed and are negative.  Physical Exam Updated Vital Signs BP (!) 161/85   Pulse 74   Temp 97.9 F (36.6 C) (Oral)   Resp 18   SpO2 100%   Physical Exam Vitals and nursing note reviewed.  Constitutional:      General: She is not in acute distress. HENT:     Head: Normocephalic and atraumatic.     Mouth/Throat:     Mouth: Mucous membranes are moist.     Pharynx: Oropharynx is clear.  Eyes:  Extraocular Movements: Extraocular movements intact.     Conjunctiva/sclera: Conjunctivae normal.     Pupils: Pupils are equal, round, and reactive to light.  Cardiovascular:     Rate and Rhythm: Normal rate and regular rhythm.     Heart sounds: Murmur heard.    No friction rub. No gallop.  Pulmonary:     Effort: Pulmonary effort is normal.     Breath sounds: No stridor. No wheezing, rhonchi or rales.  Abdominal:     General: There is no distension.     Palpations: Abdomen is soft.     Tenderness: There is no abdominal tenderness. There is no guarding or rebound.  Musculoskeletal:      Cervical back: Normal range of motion. No rigidity.     Right lower leg: No edema.     Left lower leg: No edema.  Skin:    General: Skin is warm and dry.  Neurological:     Mental Status: She is alert and oriented to person, place, and time. Mental status is at baseline.     Comments: Mental status: a&o x4 Speech: clear, no dysarthria CN II: visual fields grossly intact CN III/IV/VI: PERRL, EOMI CN V: facial sensation grossly intact CN VII: no facial droop CN VIII: no nystagmus, audition intact to speech VN IX/X: swallow intact CN XI: Motor function grossly intact CN XII: midline tongue w/o atrophy or fasciculation Moving all 4 extremities spontaneously Coordination: Grossly intact Gait: Untested   Psychiatric:        Mood and Affect: Mood normal.        Behavior: Behavior normal.    ED Results / Procedures / Treatments   Labs (all labs ordered are listed, but only abnormal results are displayed) Labs Reviewed  BASIC METABOLIC PANEL - Abnormal; Notable for the following components:      Result Value   Glucose, Bld 114 (*)    All other components within normal limits    EKG None  Radiology No results found.  Procedures Procedures   Medications Ordered in ED Medications  ibuprofen (ADVIL) tablet 600 mg (600 mg Oral Given 10/31/20 2233)    ED Course  I have reviewed the triage vital signs and the nursing notes.  Pertinent labs & imaging results that were available during my care of the patient were reviewed by me and considered in my medical decision making (see chart for details).    MDM Rules/Calculators/A&P                          This is a 75 year old female with PMHx HTN not on medication, presenting for elevated blood pressures at night (systolic 190s), with headache since last night.  Reassuring physical exam.  Initial interventions: Ibuprofen with some improvement of symptoms  All studies independently reviewed by myself, d/w the attending  physician, factored into my MDM. -EKG: NSR 65 bpm, normal axis, normal intervals, no acute ST changes, essentially unchanged compared to prior from 05/2018 -Unremarkable: BMP -Pending: CT head noncontrast  Presentation appears most consistent with hypertensive urgency.  CT imaging pending for intracranial pathology in context of her associated headache, although low degree of suspicion for any concerning findings; neuro intact, behaving at baseline, does not appear to be in acute distress.  For similar reasons, low degree of suspicion for dissection as well.  Plan to follow-up on CT head results.  If normal, likely discharge home.'s plan was discussed with the patient who understands and  agrees.  Patient HDS on reevaluation.  Course of care and plan were discussed with oncoming team, to patient care was transferred.  Final Clinical Impression(s) / ED Diagnoses Final diagnoses:  None    Rx / DC Orders ED Discharge Orders     None        Colvin Caroli, MD 10/31/20 3976    Milagros Loll, MD 11/02/20 2015

## 2020-10-31 NOTE — ED Provider Notes (Signed)
MSE was initiated and I personally evaluated the patient and placed orders (if any) at  2:53 AM on October 31, 2020.  Here with Headache and  elevated blood pressures at home. Last night she reports systolic 199. No chest pain, SOb.   Today's Vitals   10/31/20 0223 10/31/20 0226  BP: (!) 196/84   Pulse: 74   Resp: 18   Temp: 98.7 F (37.1 C)   SpO2: 99%   PainSc:  7    There is no height or weight on file to calculate BMI.  RRR Lungs clear  The patient appears stable so that the remainder of the MSE may be completed by another provider.   Elpidio Anis, PA-C 10/31/20 0255    Nira Conn, MD 10/31/20 [phone]

## 2020-10-31 NOTE — ED Provider Notes (Signed)
Blood pressure (!) 190/86, pulse 67, temperature 97.9 F (36.6 C), temperature source Oral, resp. rate 18, SpO2 99 %.  Assuming care from Dr. Stevie Kern.  In short, Stephanie Smith is a 75 y.o. female with a chief complaint of Hypertension .  Refer to the original H&P for additional details.  The current plan of care is to follow up on CT head and reassess.  12:10 AM  Patient CT imaging of the head reviewed showing no acute findings.  Her basic metabolic panel is similarly unremarkable.  She is feeling well.  She plans to call her PCP regarding blood pressure medication management.  Discussed ED return precautions. Stable for discharge.     Maia Plan, MD 11/01/20 [phone]

## 2020-10-31 NOTE — ED Notes (Signed)
Pt ambulated to the BR without assistance. 

## 2020-10-31 NOTE — ED Triage Notes (Signed)
Headache started around midnight, pt checked her BP and was elevated. Does not take any BP med.  Denies any chest pain and dizziness.

## 2020-11-01 NOTE — Discharge Instructions (Addendum)

## 2023-01-10 IMAGING — CT CT HEAD W/O CM
4 series · 17 of 47 positions shown, 19 images · non-contrast
Comparison: Head CT and brain MRI 06/06/2018

CLINICAL DATA: Headache, intracranial hemorrhage suspected

EXAM:
CT HEAD WITHOUT CONTRAST
TECHNIQUE: Contiguous axial images were obtained from the base of the skull
through the vertex without intravenous contrast.

[Series 2: head without · axial · non-contrast · 0.40mm/px · z∈[-148,-28]mm · 7 of 33 slices shown, 9 images]
[im 5/33  brain]
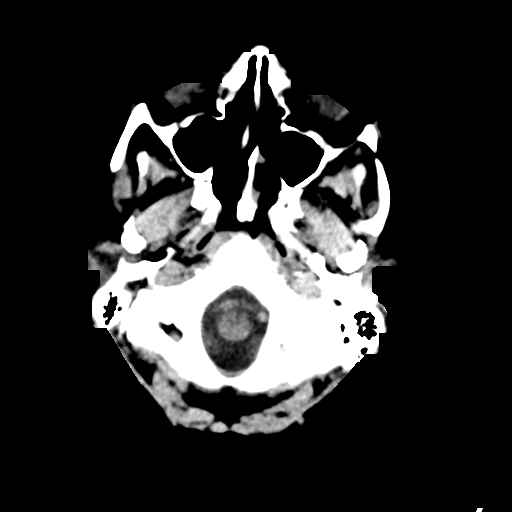
[im 5/33  bone]
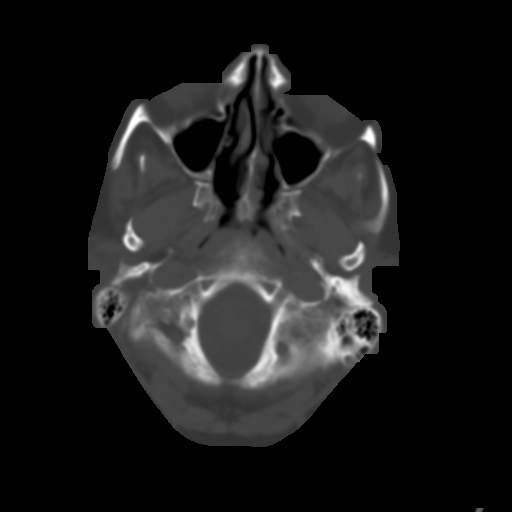
[im 9/33  brain]
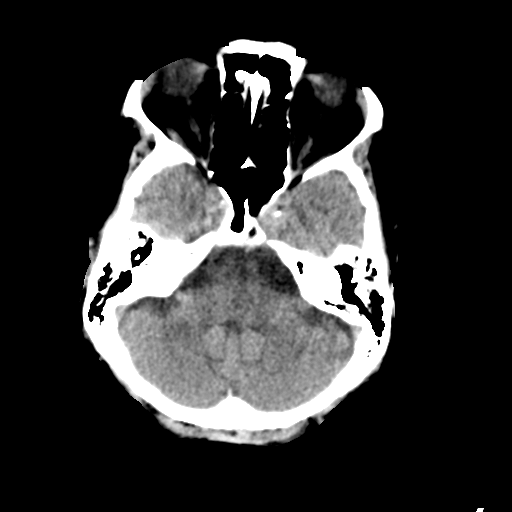
[im 13/33  brain]
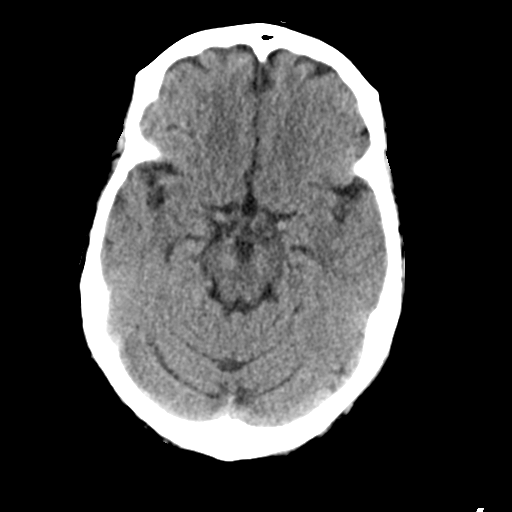
[im 17/33  brain]
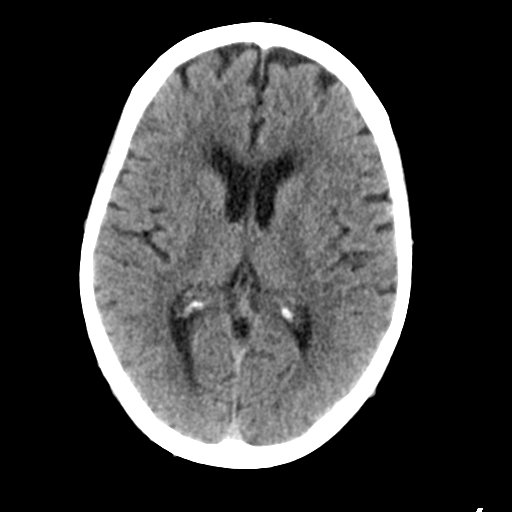
[im 21/33  brain]
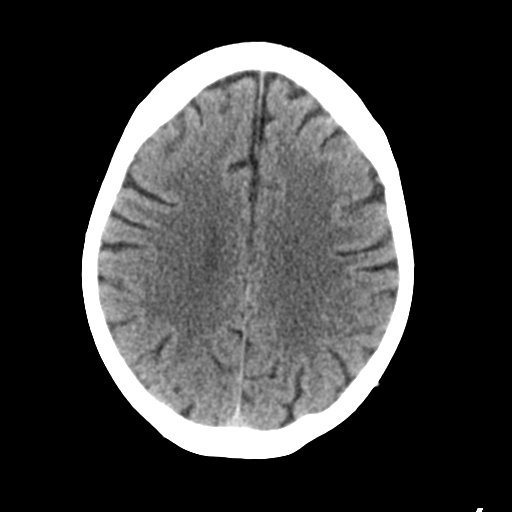
[im 21/33  bone]
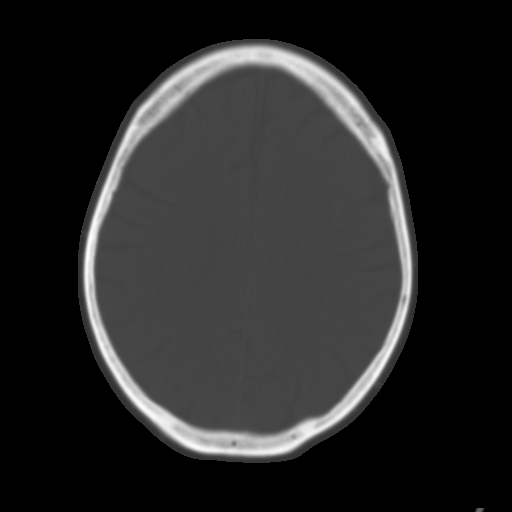
[im 25/33  brain]
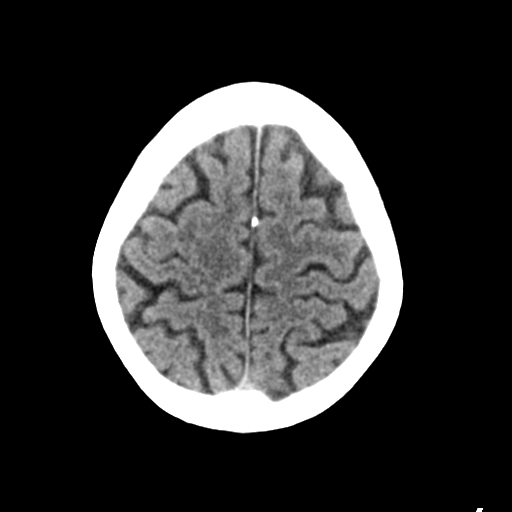
[im 29/33  brain]
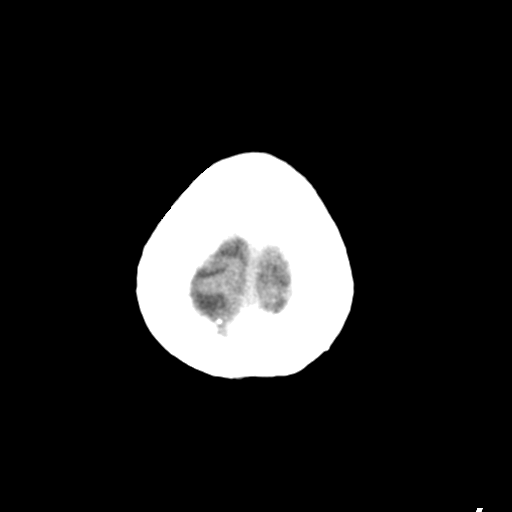

[Series 3: head bone · axial · 0.40mm/px · z∈[-152,-96]mm · 4 of 82 slices shown]
[im 9/82  bone]
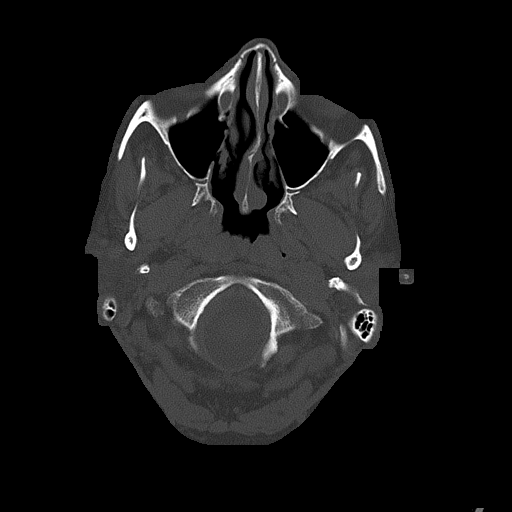
[im 17/82  bone]
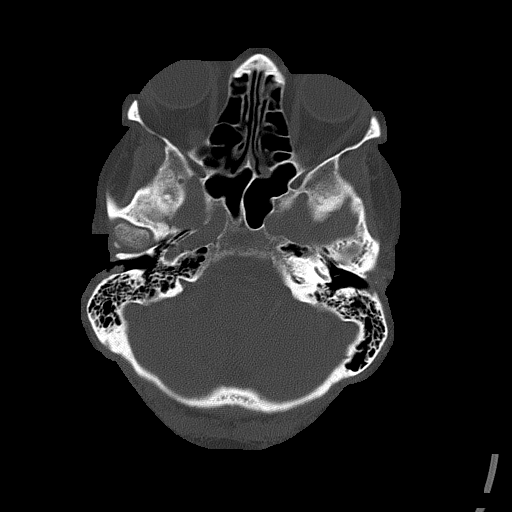
[im 25/82  bone]
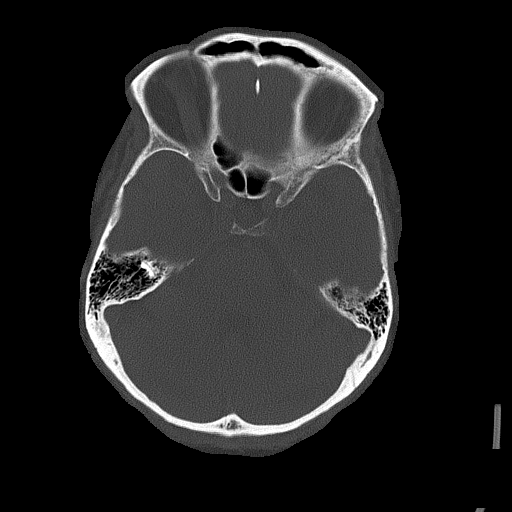
[im 37/82  bone]
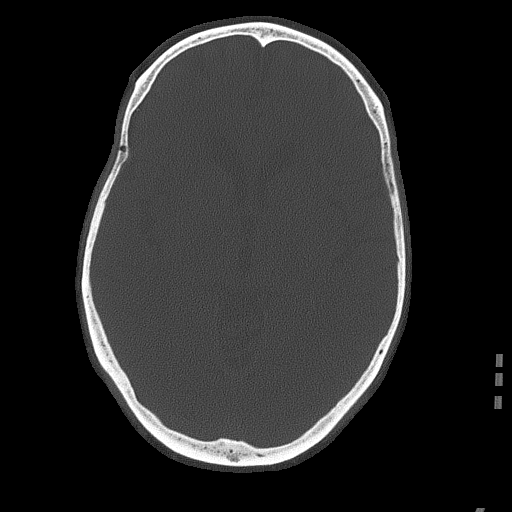

[Series 4: head without cor · coronal · non-contrast · 0.30mm/px · 3 of 67 slices shown]
[im 23/67  brain]
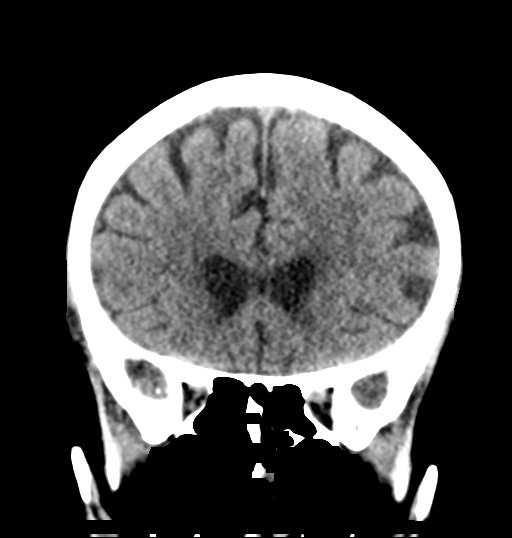
[im 30/67  brain]
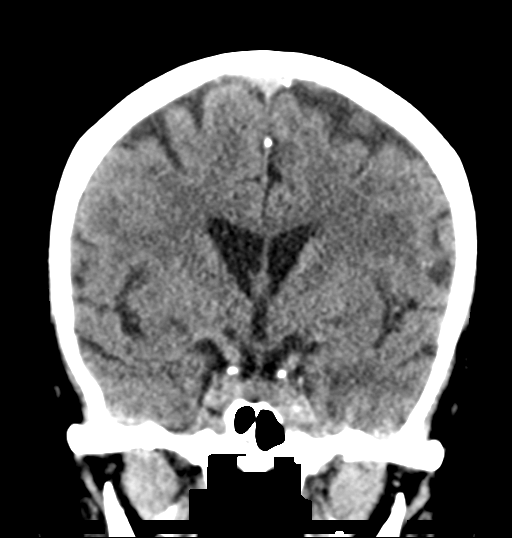
[im 37/67  brain]
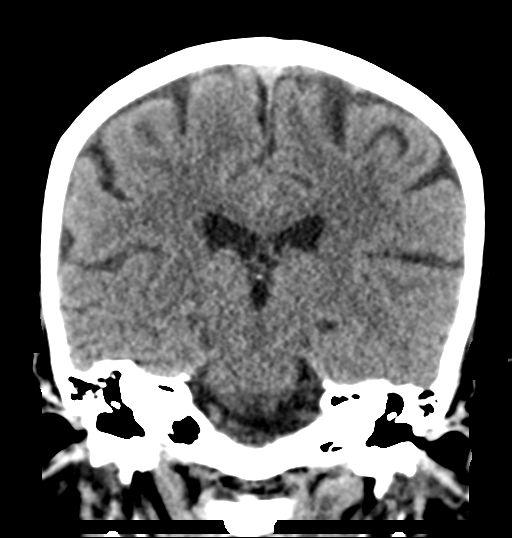

[Series 5: head without sag · sagittal · non-contrast · 0.32mm/px · 3 of 67 slices shown]
[im 23/67  brain]
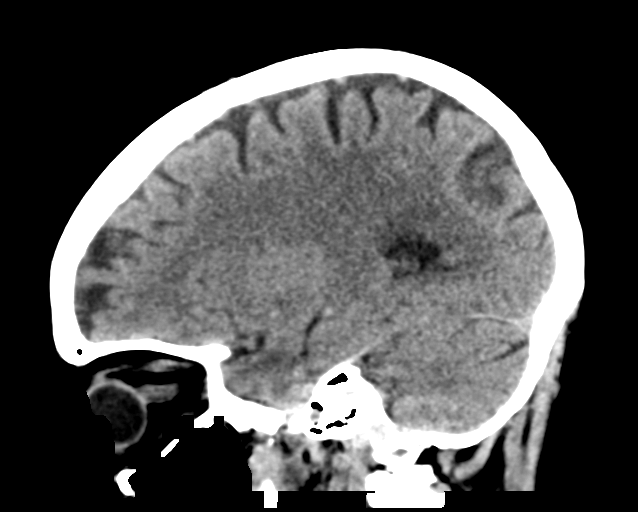
[im 34/67  brain]
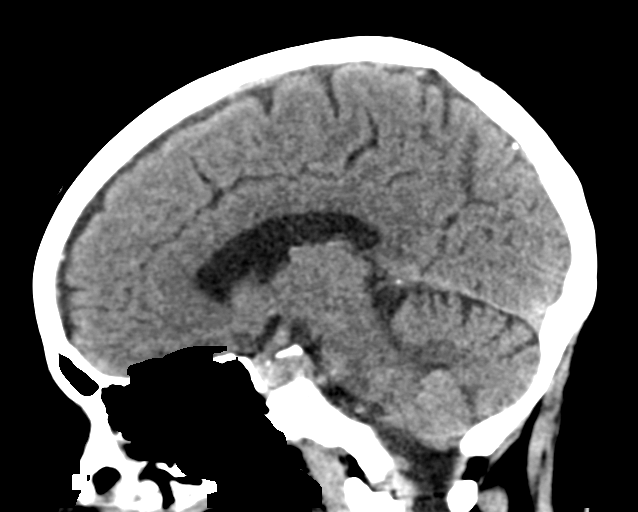
[im 45/67  brain]
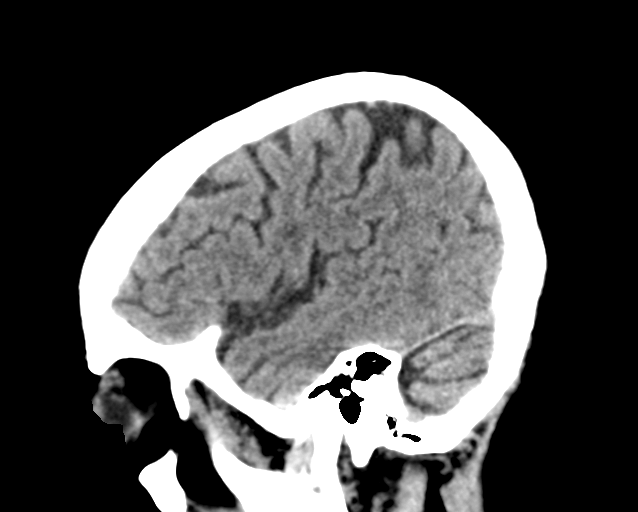

[17 of 47 positions shown; findings below may reference images not displayed]

FINDINGS: Brain: Stable degree of atrophy and mild chronic small vessel
ischemia. No intracranial hemorrhage, mass effect, or midline shift.
No hydrocephalus. The basilar cisterns are patent. No evidence of
territorial infarct or acute ischemia. No extra-axial or
intracranial fluid collection.

Vascular: No hyperdense vessel.

Skull: No fracture or focal lesion.

Sinuses/Orbits: Paranasal sinuses and mastoid air cells are clear.
The visualized orbits are unremarkable.

Other: None.
IMPRESSION: 1. No acute intracranial abnormality.
2. Stable atrophy and chronic small vessel ischemia.

## 2023-03-22 DIAGNOSIS — C3491 Malignant neoplasm of unspecified part of right bronchus or lung: Secondary | ICD-10-CM | POA: Diagnosis not present

## 2023-03-22 DIAGNOSIS — R9089 Other abnormal findings on diagnostic imaging of central nervous system: Secondary | ICD-10-CM | POA: Diagnosis not present

## 2023-03-22 DIAGNOSIS — Z5211 Skin donor, autologous: Secondary | ICD-10-CM | POA: Diagnosis not present

## 2023-03-22 DIAGNOSIS — Z5181 Encounter for therapeutic drug level monitoring: Secondary | ICD-10-CM | POA: Diagnosis not present

## 2023-03-22 DIAGNOSIS — C7931 Secondary malignant neoplasm of brain: Secondary | ICD-10-CM | POA: Diagnosis not present

## 2023-03-22 DIAGNOSIS — Z79811 Long term (current) use of aromatase inhibitors: Secondary | ICD-10-CM | POA: Diagnosis not present

## 2023-03-22 DIAGNOSIS — Z79899 Other long term (current) drug therapy: Secondary | ICD-10-CM | POA: Diagnosis not present

## 2023-08-01 ENCOUNTER — Encounter (HOSPITAL_COMMUNITY): Payer: Self-pay | Admitting: *Deleted

## 2023-08-01 ENCOUNTER — Ambulatory Visit (HOSPITAL_COMMUNITY)
Admission: EM | Admit: 2023-08-01 | Discharge: 2023-08-01 | Disposition: A | Discharge status: Home / Self Care | Attending: Physician Assistant | Admitting: Physician Assistant

## 2023-08-01 DIAGNOSIS — I10 Essential (primary) hypertension: Secondary | ICD-10-CM | POA: Insufficient documentation

## 2023-08-01 DIAGNOSIS — N3000 Acute cystitis without hematuria: Secondary | ICD-10-CM | POA: Insufficient documentation

## 2023-08-01 DIAGNOSIS — J209 Acute bronchitis, unspecified: Secondary | ICD-10-CM | POA: Diagnosis not present

## 2023-08-01 LAB — POCT URINALYSIS DIP (MANUAL ENTRY)
Bilirubin, UA: NEGATIVE
Glucose, UA: NEGATIVE mg/dL
Ketones, POC UA: NEGATIVE mg/dL
Nitrite, UA: NEGATIVE
Protein Ur, POC: NEGATIVE mg/dL
Spec Grav, UA: 1.015 (ref 1.010–1.025)
Urobilinogen, UA: 1 U/dL
pH, UA: 6 (ref 5.0–8.0)

## 2023-08-01 MED ORDER — SULFAMETHOXAZOLE-TRIMETHOPRIM 800-160 MG PO TABS: 1.0000 | ORAL_TABLET | Freq: Two times a day (BID) | ORAL | 0 refills | Status: AC

## 2023-08-01 NOTE — ED Provider Notes (Signed)
 [facility]    CSN: [id number] Arrival date & time: 08/01/23  1613      History   Chief Complaint Chief Complaint  Patient presents with   Cough    HPI Stephanie Smith is a 78 y.o. female.   Patient here today for evaluation of cough she has had for 2 months.  She has not taken any medication for same.  She denies any fever.  She has not had any ear pain or sore throat.  She does report some vomiting with symptoms.  She also reports some low back pain and frequent urination that is been present for a week and a half.  The history is provided by the patient.  Cough Associated symptoms: no chills, no ear pain, no eye discharge, no fever, no shortness of breath, no sore throat and no wheezing     History reviewed. No pertinent past medical history.  Patient Active Problem List   Diagnosis Date Noted   Pain in the chest    Dyspnea    Chest pain 10/22/2015   Hypertension 03/23/2012    Past Surgical History:  Procedure Laterality Date   ABDOMINAL HYSTERECTOMY     CARDIAC CATHETERIZATION N/A 10/23/2015   Procedure: Left Heart Cath and Coronary Angiography;  Surgeon: Knox Perl, MD;  Location: [facility] INVASIVE CV [facility];  Service: Cardiovascular;  Laterality: N/A;    OB History   No obstetric history on file.      Home Medications    Prior to Admission medications   Medication Sig Start Date End Date Taking? Authorizing Provider  sulfamethoxazole -trimethoprim  (BACTRIM  DS) 800-160 MG tablet Take 1 tablet by mouth 2 (two) times daily for 7 days. 08/01/23 08/08/23 Yes Vernestine Gondola, PA-C  Cyanocobalamin (VITAMIN B-12 PO) Take 1 tablet by mouth daily.    [provider]  ibuprofen  (ADVIL ,MOTRIN ) 600 MG tablet Take 1 tablet (600 mg total) by mouth every 8 (eight) hours as needed. Patient not taking: Reported on 10/22/2015 01/25/15   Eliodoro Guerin, DO  lisinopril  (ZESTRIL ) 10 MG tablet Take 1 tablet (10 mg total) by mouth daily. Patient not taking: Reported  on 06/06/2018 10/24/15   Yoo, Elsia J, DO  meclizine  (ANTIVERT ) 25 MG tablet Take 1 tablet (25 mg total) by mouth 3 (three) times daily as needed for dizziness. Patient not taking: Reported on 10/31/2020 06/06/18   Hershel Los, MD  Pyridoxine HCl (VITAMIN B-6 PO) Take 1 tablet by mouth daily.    [provider]  VITAMIN D PO Take 1,000 Units by mouth daily.     [provider]    Family History History reviewed. No pertinent family history.  Social History Social History   Tobacco Use   Smoking status: Never   Smokeless tobacco: Never  Vaping Use   Vaping status: Never Used  Substance Use Topics   Alcohol use: No   Drug use: No     Allergies   Morphine and Aspirin    Review of Systems Review of Systems  Constitutional:  Negative for chills and fever.  HENT:  Negative for congestion, ear pain and sore throat.   Eyes:  Negative for discharge and redness.  Respiratory:  Positive for cough. Negative for shortness of breath and wheezing.   Gastrointestinal:  Negative for abdominal pain, diarrhea, nausea and vomiting.  Genitourinary:  Positive for frequency.  Musculoskeletal:  Positive for back pain.     Physical Exam Triage Vital Signs ED Triage Vitals  Encounter Vitals Group  BP 08/01/23 1700 (!) 190/87     Systolic BP Percentile --      Diastolic BP Percentile --      Pulse Rate 08/01/23 1700 66     Resp 08/01/23 1700 18     Temp 08/01/23 1700 98 F (36.7 C)     Temp Source 08/01/23 1700 Oral     SpO2 08/01/23 1700 96 %     Weight --      Height --      Head Circumference --      Peak Flow --      Pain Score 08/01/23 1659 4     Pain Loc --      Pain Education --      Exclude from Growth Chart --    No data found.  Updated Vital Signs BP (!) 190/87 (BP Location: Right Arm)   Pulse 66   Temp 98 F (36.7 C) (Oral)   Resp 18   SpO2 96%   Visual Acuity Right Eye Distance:   Left Eye Distance:   Bilateral Distance:    Right Eye  Near:   Left Eye Near:    Bilateral Near:     Physical Exam Vitals and nursing note reviewed.  Constitutional:      General: She is not in acute distress.    Appearance: Normal appearance. She is not ill-appearing.  HENT:     Head: Normocephalic and atraumatic.     Right Ear: There is impacted cerumen.     Left Ear: There is impacted cerumen.     Nose: No congestion or rhinorrhea.     Mouth/Throat:     Mouth: Mucous membranes are moist.     Pharynx: No oropharyngeal exudate or posterior oropharyngeal erythema.  Eyes:     Conjunctiva/sclera: Conjunctivae normal.  Cardiovascular:     Rate and Rhythm: Normal rate and regular rhythm.     Heart sounds: Normal heart sounds. No murmur heard. Pulmonary:     Effort: Pulmonary effort is normal. No respiratory distress.     Breath sounds: Wheezing (rare, scattered) present. No rhonchi or rales.  Skin:    General: Skin is warm and dry.  Neurological:     Mental Status: She is alert.  Psychiatric:        Mood and Affect: Mood normal.        Thought Content: Thought content normal.      UC Treatments / Results  Labs (all labs ordered are listed, but only abnormal results are displayed) Labs Reviewed  POCT URINALYSIS DIP (MANUAL ENTRY) - Abnormal; Notable for the following components:      Result Value   Blood, UA trace-intact (*)    Leukocytes, UA Small (1+) (*)    All other components within normal limits  URINE CULTURE    EKG   Radiology No results found.  Procedures Procedures (including critical care time)  Medications Ordered in UC Medications - No data to display  Initial Impression / Assessment and Plan / UC Course  I have reviewed the triage vital signs and the nursing notes.  Pertinent labs & imaging results that were available during my care of the patient were reviewed by me and considered in my medical decision making (see chart for details).    Patient declines treatment for bronchitis.  She states she  is concerned with steroids and would like to defer.  Will trial antibiotics given suspected UTI.  Urine culture ordered.  Advised follow-up if  no gradual improvement of any symptoms or with any worsening.  Patient expresses understanding.  Discussed elevated blood pressure in office and she states that it will be improved when she leaves and this is typical for her when she is in a medical facility.  She denies any chest pain, shortness of breath, headache.  Final Clinical Impressions(s) / UC Diagnoses   Final diagnoses:  Acute bronchitis, unspecified organism  Acute cystitis without hematuria  Hypertension, unspecified type   Discharge Instructions   None    ED Prescriptions     Medication Sig Dispense Auth. Provider   sulfamethoxazole -trimethoprim  (BACTRIM  DS) 800-160 MG tablet Take 1 tablet by mouth 2 (two) times daily for 7 days. 14 tablet Vernestine Gondola, PA-C      PDMP not reviewed this encounter.   Vernestine Gondola, PA-C 08/01/23 1730

## 2023-08-01 NOTE — ED Triage Notes (Signed)
 Pt states she has had a cough x 2 months she is now taking nothing for it.   She is having low back pain and frequent urination X 1.5 weeks.

## 2023-08-03 LAB — URINE CULTURE
